# Patient Record
Sex: Male | Born: 1990 | Race: Black or African American | Hispanic: No | Marital: Single | Smoking: Current every day smoker
Health system: Southern US, Community
[De-identification: ages and names within clinical notes are randomized; demographics above are authoritative.]

## PROBLEM LIST (undated history)

## (undated) HISTORY — PX: ABDOMINAL SURGERY: SHX537

## (undated) HISTORY — PX: WISDOM TOOTH EXTRACTION: SHX21

---
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
## 2010-12-22 ENCOUNTER — Inpatient Hospital Stay (INDEPENDENT_AMBULATORY_CARE_PROVIDER_SITE_OTHER)
Admission: RE | Admit: 2010-12-22 | Discharge: 2010-12-22 | Disposition: A | Discharge status: Home / Self Care | Payer: Self-pay | Source: Ambulatory Visit | Attending: Emergency Medicine | Admitting: Emergency Medicine

## 2010-12-22 DIAGNOSIS — B86 Scabies: Secondary | ICD-10-CM

## 2012-10-07 ENCOUNTER — Encounter (HOSPITAL_COMMUNITY): Payer: Self-pay | Admitting: Emergency Medicine

## 2012-10-07 ENCOUNTER — Emergency Department (HOSPITAL_COMMUNITY)
Admission: EM | Admit: 2012-10-07 | Discharge: 2012-10-07 | Disposition: A | Discharge status: Home / Self Care | Payer: No Typology Code available for payment source | Attending: Emergency Medicine | Admitting: Emergency Medicine

## 2012-10-07 ENCOUNTER — Emergency Department (HOSPITAL_COMMUNITY): Payer: No Typology Code available for payment source

## 2012-10-07 DIAGNOSIS — Y9389 Activity, other specified: Secondary | ICD-10-CM | POA: Insufficient documentation

## 2012-10-07 DIAGNOSIS — S0083XA Contusion of other part of head, initial encounter: Secondary | ICD-10-CM | POA: Insufficient documentation

## 2012-10-07 DIAGNOSIS — S0003XA Contusion of scalp, initial encounter: Secondary | ICD-10-CM | POA: Insufficient documentation

## 2012-10-07 DIAGNOSIS — F17200 Nicotine dependence, unspecified, uncomplicated: Secondary | ICD-10-CM | POA: Insufficient documentation

## 2012-10-07 DIAGNOSIS — S20211A Contusion of right front wall of thorax, initial encounter: Secondary | ICD-10-CM

## 2012-10-07 DIAGNOSIS — S20219A Contusion of unspecified front wall of thorax, initial encounter: Secondary | ICD-10-CM | POA: Insufficient documentation

## 2012-10-07 DIAGNOSIS — Y92410 Unspecified street and highway as the place of occurrence of the external cause: Secondary | ICD-10-CM | POA: Insufficient documentation

## 2012-10-07 MED ORDER — IBUPROFEN 600 MG PO TABS: 600.0000 mg | ORAL_TABLET | Freq: Four times a day (QID) | ORAL | Status: DC | PRN

## 2012-10-07 MED ORDER — OXYCODONE-ACETAMINOPHEN 5-325 MG PO TABS
2.0000 | ORAL_TABLET | Freq: Once | ORAL | Status: AC
  Administered 2012-10-07: 2 via ORAL
  Filled 2012-10-07: qty 2

## 2012-10-07 MED ORDER — OXYCODONE-ACETAMINOPHEN 5-325 MG PO TABS: 2.0000 | ORAL_TABLET | ORAL | Status: DC | PRN

## 2012-10-07 MED ORDER — IBUPROFEN 800 MG PO TABS
800.0000 mg | ORAL_TABLET | Freq: Once | ORAL | Status: AC
  Administered 2012-10-07: 800 mg via ORAL
  Filled 2012-10-07: qty 1

## 2012-10-07 NOTE — ED Notes (Signed)
md at bedside  Pt alert and oriented x4. Respirations even and unlabored, bilateral symmetrical rise and fall of chest. Skin warm and dry. In no acute distress. Denies needs.   
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
## 2012-10-07 NOTE — ED Notes (Signed)
Pt back from x-ray.

## 2012-10-07 NOTE — ED Notes (Addendum)
md at bedside

## 2012-10-07 NOTE — ED Notes (Signed)
Abrasion to right jaw.

## 2012-10-07 NOTE — ED Notes (Signed)
Pt to xray

## 2012-10-20 ENCOUNTER — Ambulatory Visit: Payer: No Typology Code available for payment source | Attending: Family Medicine | Admitting: Physical Therapy

## 2012-10-22 ENCOUNTER — Ambulatory Visit: Payer: No Typology Code available for payment source | Attending: Family Medicine | Admitting: Physical Therapy

## 2012-10-22 DIAGNOSIS — M25559 Pain in unspecified hip: Secondary | ICD-10-CM | POA: Insufficient documentation

## 2012-10-22 DIAGNOSIS — M545 Low back pain, unspecified: Secondary | ICD-10-CM | POA: Insufficient documentation

## 2012-10-22 DIAGNOSIS — IMO0001 Reserved for inherently not codable concepts without codable children: Secondary | ICD-10-CM | POA: Insufficient documentation

## 2012-10-26 ENCOUNTER — Ambulatory Visit: Payer: No Typology Code available for payment source | Admitting: Rehabilitation

## 2012-10-28 ENCOUNTER — Ambulatory Visit: Payer: No Typology Code available for payment source | Admitting: Rehabilitation

## 2014-11-02 ENCOUNTER — Encounter (HOSPITAL_COMMUNITY): Payer: Self-pay | Admitting: Emergency Medicine

## 2014-11-02 ENCOUNTER — Emergency Department (HOSPITAL_COMMUNITY)
Admission: EM | Admit: 2014-11-02 | Discharge: 2014-11-02 | Disposition: A | Discharge status: Home / Self Care | Payer: Self-pay | Attending: Emergency Medicine | Admitting: Emergency Medicine

## 2014-11-02 DIAGNOSIS — Z720 Tobacco use: Secondary | ICD-10-CM | POA: Insufficient documentation

## 2014-11-02 DIAGNOSIS — K088 Other specified disorders of teeth and supporting structures: Secondary | ICD-10-CM | POA: Insufficient documentation

## 2014-11-02 DIAGNOSIS — Z79899 Other long term (current) drug therapy: Secondary | ICD-10-CM | POA: Insufficient documentation

## 2014-11-02 DIAGNOSIS — K0889 Other specified disorders of teeth and supporting structures: Secondary | ICD-10-CM

## 2014-11-02 MED ORDER — NAPROXEN 500 MG PO TABS: 500.0000 mg | ORAL_TABLET | Freq: Two times a day (BID) | ORAL | Status: DC

## 2014-11-02 MED ORDER — PENICILLIN V POTASSIUM 500 MG PO TABS: 500.0000 mg | ORAL_TABLET | Freq: Four times a day (QID) | ORAL | Status: DC

## 2014-11-02 NOTE — Discharge Instructions (Signed)

## 2014-11-02 NOTE — ED Notes (Signed)
Patient states has several broken teeth on L side.  Patient states has not been to dentist in years.   Patient states that "I've got to find a dentist that is reasonable".   Patient complains of pain in R lower teeth.

## 2014-11-02 NOTE — ED Provider Notes (Signed)
CSN: [id number]     Arrival date & time 11/02/14  1138 History  This chart was scribed for Waynetta Pean, PA-C working with Orpah Greek, MD by Randa Evens, ED Scribe. This patient was seen in room TR07C/TR07C and the patient's care was started at 11:54 AM.    Chief Complaint  Patient presents with  . Dental Pain   Patient is a 24 y.o. male presenting with tooth pain. The history is provided by the patient. No language interpreter was used.  Dental Pain Associated symptoms: no fever and no headaches    HPI Comments: Omar Moyer is a 24 y.o. male who presents to the Emergency Department complaining of intermittent right lower dental pain onset 1 year prior that's has recently worsened yesterday when the pain became constant and severe. He rates his pain at 10/10.  Pt states that he believes the filling popped off about 1 year ago. Pt states that he has tried aspirin with no relief. Denies fever, chills, trouble swallowing, changes to his vision, nausea, vomiting or ear pain.     History reviewed. No pertinent past medical history. Past Surgical History  Procedure Laterality Date  . Abdominal surgery      hernia repair   No family history on file. History  Substance Use Topics  . Smoking status: Current Every Day Smoker  . Smokeless tobacco: Not on file  . Alcohol Use: Yes     Comment: ooca    Review of Systems  Constitutional: Negative for fever and chills.  HENT: Positive for dental problem. Negative for ear pain, sore throat and trouble swallowing.   Eyes: Negative for pain and visual disturbance.  Gastrointestinal: Negative for nausea, vomiting and abdominal pain.  Skin: Negative for rash.  Neurological: Negative for light-headedness and headaches.     Allergies  Review of patient's allergies indicates no known allergies.  Home Medications   Prior to Admission medications   Medication Sig Start Date End Date Taking? Authorizing Provider  ibuprofen  (ADVIL,MOTRIN) 600 MG tablet Take 1 tablet (600 mg total) by mouth every 6 (six) hours as needed for pain. 10/07/12   Lacretia Leigh, MD  naproxen (NAPROSYN) 500 MG tablet Take 1 tablet (500 mg total) by mouth 2 (two) times daily with a meal. 11/02/14   Waynetta Pean, PA-C  oxyCODONE-acetaminophen (PERCOCET/ROXICET) 5-325 MG per tablet Take 2 tablets by mouth every 4 (four) hours as needed for pain. 10/07/12   Lacretia Leigh, MD  penicillin v potassium (VEETID) 500 MG tablet Take 1 tablet (500 mg total) by mouth 4 (four) times daily. 11/02/14   Waynetta Pean, PA-C   BP 144/76 mmHg  Pulse 54  Temp(Src) 98.6 F (37 C) (Oral)  Resp 14  SpO2 99%   Physical Exam  Constitutional: He is oriented to person, place, and time. He appears well-developed and well-nourished. No distress.  Nontoxic appearing.  HENT:  Head: Normocephalic and atraumatic.  Right Ear: External ear normal.  Left Ear: External ear normal.  Nose: Nose normal.  Mouth/Throat: Uvula is midline and oropharynx is clear and moist. No dental abscesses or uvula swelling. No oropharyngeal exudate.  Soft palate rises symmetrically, uvula midline without edema. No dental abscess, fluctuance, induration or discharge from the mouth. Tenderness to right lower molar. Bilateral tympanic membranes are pearly-gray without erythema or loss of landmarks.   Eyes: Conjunctivae are normal. Pupils are equal, round, and reactive to light. Right eye exhibits no discharge. Left eye exhibits no discharge.  Neck: Normal  range of motion. Neck supple. No JVD present.  Cardiovascular: Normal rate, regular rhythm, normal heart sounds and intact distal pulses.   Pulmonary/Chest: Effort normal and breath sounds normal. No respiratory distress.  Abdominal: Soft. There is no tenderness.  Lymphadenopathy:    He has no cervical adenopathy.  Neurological: He is alert and oriented to person, place, and time. No cranial nerve deficit. Coordination normal.  Cranial  nerves intact.   Skin: Skin is warm and dry. No rash noted. He is not diaphoretic.  Psychiatric: He has a normal mood and affect. His behavior is normal.  Nursing note and vitals reviewed.   ED Course  Procedures (including critical care time) DIAGNOSTIC STUDIES: Oxygen Saturation is 99% on RA, normal by my interpretation.    COORDINATION OF CARE: 12:19 PM-Discussed treatment plan with pt at bedside and pt agreed to plan.     Labs Review Labs Reviewed - No data to display  Imaging Review No results found.   EKG Interpretation None      Filed Vitals:   11/02/14 1144  BP: 144/76  Pulse: 54  Temp: 98.6 F (37 C)  TempSrc: Oral  Resp: 14  SpO2: 99%     MDM   Meds given in ED:  Medications - No data to display  New Prescriptions   NAPROXEN (NAPROSYN) 500 MG TABLET    Take 1 tablet (500 mg total) by mouth 2 (two) times daily with a meal.   PENICILLIN V POTASSIUM (VEETID) 500 MG TABLET    Take 1 tablet (500 mg total) by mouth 4 (four) times daily.    Final diagnoses:  Pain, dental    Patient with right lower toothache.  No gross abscess. He is afebrile and nontoxic appearing. Exam unconcerning for Ludwig's angina or spread of infection.  Will treat with penicillin and naproxen.  Urged patient to follow-up with dentist Dr. Mariel Sleet. I advised to take his discharge instructions to his appointment. I advised the patient to follow-up with their primary care provider this week. I advised the patient to return to the emergency department with new or worsening symptoms or new concerns. The patient verbalized understanding and agreement with plan.    I personally performed the services described in this documentation, which was scribed in my presence. The recorded information has been reviewed and is accurate.      Waynetta Pean, PA-C 11/02/14 1228  Orpah Greek, MD 11/03/14 [id number]

## 2016-09-01 ENCOUNTER — Encounter (HOSPITAL_COMMUNITY): Payer: Self-pay | Admitting: Emergency Medicine

## 2016-09-01 ENCOUNTER — Ambulatory Visit (HOSPITAL_COMMUNITY)
Admission: EM | Admit: 2016-09-01 | Discharge: 2016-09-01 | Disposition: A | Discharge status: Home / Self Care | Payer: Self-pay | Attending: Emergency Medicine | Admitting: Emergency Medicine

## 2016-09-01 DIAGNOSIS — J029 Acute pharyngitis, unspecified: Secondary | ICD-10-CM | POA: Insufficient documentation

## 2016-09-01 DIAGNOSIS — R0982 Postnasal drip: Secondary | ICD-10-CM | POA: Insufficient documentation

## 2016-09-01 DIAGNOSIS — J069 Acute upper respiratory infection, unspecified: Secondary | ICD-10-CM

## 2016-09-01 LAB — POCT RAPID STREP A: STREPTOCOCCUS, GROUP A SCREEN (DIRECT): NEGATIVE

## 2016-09-01 MED ORDER — CHLORPHEN-PE-ACETAMINOPHEN 4-10-325 MG PO TABS: ORAL_TABLET | ORAL | 0 refills | Status: DC

## 2016-09-01 NOTE — ED Provider Notes (Signed)
CSN: [id number]     Arrival date & time 09/01/16  1012 History   First MD Initiated Contact with Patient 09/01/16 1117     Chief Complaint  Patient presents with  . Sore Throat   (Consider location/radiation/quality/duration/timing/severity/associated sxs/prior Treatment) 26 year old male with sore throat for a week. Been getting worse over the past few days and this morning he felt like he had a golf ball in his throat. He has been feeling he will, sick like over the weekend. He also has headache and subjective often known fever. He has sniffles and heavy PND. Not taking any medications. Currently he is afebrile and has normal vital signs.      History reviewed. No pertinent past medical history. Past Surgical History:  Procedure Laterality Date  . ABDOMINAL SURGERY     hernia repair   Family History  Problem Relation Age of Onset  . Diabetes Father    Social History  Substance Use Topics  . Smoking status: Current Every Day Smoker  . Smokeless tobacco: Not on file  . Alcohol use Yes     Comment: ooca    Review of Systems  Constitutional: Positive for activity change and fever. Negative for diaphoresis and fatigue.  HENT: Positive for congestion, postnasal drip, rhinorrhea, sore throat and trouble swallowing. Negative for ear pain and facial swelling.   Eyes: Negative for pain, discharge and redness.  Respiratory: Positive for cough. Negative for chest tightness and shortness of breath.   Cardiovascular: Negative.   Gastrointestinal: Negative.   Musculoskeletal: Negative.  Negative for neck pain and neck stiffness.  Neurological: Negative.     Allergies  Patient has no known allergies.  Home Medications   Prior to Admission medications   Medication Sig Start Date End Date Taking? Authorizing Provider  Chlorphen-PE-Acetaminophen 4-10-325 MG TABS One q 4 to 6 hours prn drainage and congestion. May cause drowsiness. 09/01/16   Janne Napoleon, NP  ibuprofen (ADVIL,MOTRIN)  600 MG tablet Take 1 tablet (600 mg total) by mouth every 6 (six) hours as needed for pain. 10/07/12   Lacretia Leigh, MD   Meds Ordered and Administered this Visit  Medications - No data to display  BP 126/71 (BP Location: Left Arm)   Pulse 68   Temp 98.6 F (37 C) (Oral)   Resp 18   SpO2 99%  No data found.   Physical Exam  Constitutional: He is oriented to person, place, and time. He appears well-developed and well-nourished. No distress.  HENT:  Right Ear: External ear normal.  Left Ear: External ear normal.  Oropharynx with erythema. No swelling or exudate.  Lateral TMs are normal.  Eyes: EOM are normal. Pupils are equal, round, and reactive to light.  Neck: Normal range of motion. Neck supple.  Cardiovascular: Normal rate, regular rhythm, normal heart sounds and intact distal pulses.   Pulmonary/Chest: Effort normal and breath sounds normal. No respiratory distress. He has no wheezes.  Musculoskeletal: Normal range of motion. He exhibits no edema.  Lymphadenopathy:    He has no cervical adenopathy.  Neurological: He is alert and oriented to person, place, and time.  Skin: Skin is warm and dry.  Psychiatric: He has a normal mood and affect.  Nursing note and vitals reviewed.   Urgent Care Course     Procedures (including critical care time)  Labs Review Labs Reviewed  POCT RAPID STREP A   Results for orders placed or performed during the hospital encounter of 09/01/16  POCT rapid strep A (  [facility])  Result Value Ref Range   Streptococcus, Group A Screen (Direct) NEGATIVE NEGATIVE     Imaging Review No results found.   Visual Acuity Review  Right Eye Distance:   Left Eye Distance:   Bilateral Distance:    Right Eye Near:   Left Eye Near:    Bilateral Near:         MDM   1. Sore throat   2. Acute URI   3. PND (post-nasal drip)    You have an upper respiratory infection with a lot of drainage coming from your sinuses. This drainage is  running down your throat and causing you to have a very sore throat and occasional cough particularly when lying down. This is also causing headache and other discomfort. The below his a list of medications to help with your symptoms. In addition I will give you medication that she can take while you are at home or at bedtime which is a little bit more potent and can cause some drowsiness. The other listed medications do not cause drowsiness. Sudafed PE 10 mg every 4 to 6 hours as needed for congestion Allegra or Zyrtec daily as needed for drainage and runny nose. For stronger antihistamine may take Chlor-Trimeton 2 to 4 mg every 4 to 6 hours, may cause drowsiness. Saline nasal spray used frequently. Ibuprofen 600 mg every 6 hours as needed for pain, discomfort or fever. Drink plenty of fluids and stay well-hydrated.       Janne Napoleon, NP 09/01/16 1154

## 2016-09-01 NOTE — ED Triage Notes (Signed)
Sore throat, headache

## 2016-09-01 NOTE — Discharge Instructions (Signed)
You have an upper respiratory infection with a lot of drainage coming from your sinuses. This drainage is running down your throat and causing you to have a very sore throat and occasional cough particularly when lying down. This is also causing headache and other discomfort. The below his a list of medications to help with your symptoms. In addition I will give you medication that she can take while you are at home or at bedtime which is a little bit more potent and can cause some drowsiness. The other listed medications do not cause drowsiness. Sudafed PE 10 mg every 4 to 6 hours as needed for congestion Allegra or Zyrtec daily as needed for drainage and runny nose. For stronger antihistamine may take Chlor-Trimeton 2 to 4 mg every 4 to 6 hours, may cause drowsiness. Saline nasal spray used frequently. Ibuprofen 600 mg every 6 hours as needed for pain, discomfort or fever. Drink plenty of fluids and stay well-hydrated.

## 2016-09-04 LAB — CULTURE, GROUP A STREP (THRC)

## 2017-04-25 ENCOUNTER — Emergency Department (HOSPITAL_COMMUNITY)
Admission: EM | Admit: 2017-04-25 | Discharge: 2017-04-25 | Disposition: A | Discharge status: Home / Self Care | Payer: No Typology Code available for payment source | Attending: Emergency Medicine | Admitting: Emergency Medicine

## 2017-04-25 ENCOUNTER — Encounter (HOSPITAL_COMMUNITY): Payer: Self-pay | Admitting: Emergency Medicine

## 2017-04-25 ENCOUNTER — Emergency Department (HOSPITAL_COMMUNITY): Payer: No Typology Code available for payment source

## 2017-04-25 DIAGNOSIS — M545 Low back pain, unspecified: Secondary | ICD-10-CM

## 2017-04-25 DIAGNOSIS — Y92410 Unspecified street and highway as the place of occurrence of the external cause: Secondary | ICD-10-CM | POA: Insufficient documentation

## 2017-04-25 DIAGNOSIS — M79605 Pain in left leg: Secondary | ICD-10-CM | POA: Insufficient documentation

## 2017-04-25 DIAGNOSIS — Y999 Unspecified external cause status: Secondary | ICD-10-CM | POA: Diagnosis not present

## 2017-04-25 DIAGNOSIS — Y939 Activity, unspecified: Secondary | ICD-10-CM | POA: Insufficient documentation

## 2017-04-25 DIAGNOSIS — F17200 Nicotine dependence, unspecified, uncomplicated: Secondary | ICD-10-CM | POA: Insufficient documentation

## 2017-04-25 MED ORDER — CYCLOBENZAPRINE HCL 10 MG PO TABS: 10.0000 mg | ORAL_TABLET | Freq: Two times a day (BID) | ORAL | 0 refills | Status: DC | PRN

## 2017-04-25 MED ORDER — IBUPROFEN 400 MG PO TABS
600.0000 mg | ORAL_TABLET | Freq: Once | ORAL | Status: AC
  Administered 2017-04-25: 600 mg via ORAL
  Filled 2017-04-25: qty 1
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
## 2017-04-25 NOTE — ED Notes (Signed)
Declined W/C at D/C and was escorted to lobby by RN. 

## 2017-04-25 NOTE — Discharge Instructions (Signed)
The x-ray of your lower back was normal, no fractures or dislocations.  I have written you a prescription for a muscle relaxant Flexeril.  Please fill this prescription and take it to help relieve lower back muscle strain.  Remember that this medicine can make you drowsy, please do not work, drive or drink alcohol while taking it.  Please take 600 mg of ibuprofen every 6 hours for pain and inflammation for the next several days.  Pills over-the-counter are generally 200 mg, so you could take 3 of them if this is the case.  Apply heat to the lower back at least once a day to help relieve your symptoms.  I have also listed the information to [facility], which is a clinic near [facility] who accepts patients that do not have insurance.  Please call and schedule an appointment if your symptoms are not improving in a week.  Return to the emergency department if you develop any new or worsening symptoms.

## 2019-04-22 ENCOUNTER — Encounter (HOSPITAL_COMMUNITY): Payer: Self-pay

## 2019-04-22 ENCOUNTER — Emergency Department (HOSPITAL_COMMUNITY): Payer: Self-pay

## 2019-04-22 ENCOUNTER — Other Ambulatory Visit: Payer: Self-pay

## 2019-04-22 ENCOUNTER — Emergency Department (HOSPITAL_COMMUNITY)
Admission: EM | Admit: 2019-04-22 | Discharge: 2019-04-22 | Disposition: A | Discharge status: Home / Self Care | Payer: Self-pay | Attending: Emergency Medicine | Admitting: Emergency Medicine

## 2019-04-22 DIAGNOSIS — F17290 Nicotine dependence, other tobacco product, uncomplicated: Secondary | ICD-10-CM | POA: Insufficient documentation

## 2019-04-22 DIAGNOSIS — M84445A Pathological fracture, left finger(s), initial encounter for fracture: Secondary | ICD-10-CM | POA: Insufficient documentation

## 2019-04-22 MED ORDER — IBUPROFEN 200 MG PO TABS: 600.0000 mg | ORAL_TABLET | Freq: Four times a day (QID) | ORAL | Status: DC

## 2019-04-22 MED ORDER — ACETAMINOPHEN 325 MG PO TABS: 650.0000 mg | ORAL_TABLET | Freq: Four times a day (QID) | ORAL | Status: DC

## 2019-04-22 MED ORDER — OXYCODONE HCL 5 MG PO TABS: 5.0000 mg | ORAL_TABLET | Freq: Four times a day (QID) | ORAL | 0 refills | Status: DC | PRN

## 2019-04-22 MED ORDER — IBUPROFEN 200 MG PO TABS
600.0000 mg | ORAL_TABLET | Freq: Once | ORAL | Status: AC
  Administered 2019-04-22: 600 mg via ORAL
  Filled 2019-04-22: qty 1

## 2019-04-22 NOTE — Discharge Instructions (Signed)
Use buddy taping to protect your finger.  NO heavy gripping. Use the oxycodone sparingly over just the first couple of days

## 2019-04-22 NOTE — Consult Note (Signed)
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  He was pulling up his socks, when he felt a pop in the digit and has had the onset of pain.  At one point he thought there was some crookedness to it that does not seem to be the same as it longer.  He denies any prodromal pain, even with weightlifting.  History reviewed. No pertinent past medical history. Past Surgical History:  Procedure Laterality Date  . ABDOMINAL SURGERY     hernia repair   Social History   Socioeconomic History  . Marital status: Single    Spouse name: Not on file  . Number of children: Not on file  . Years of education: Not on file  . Highest education level: Not on file  Occupational History  . Not on file  Social Needs  . Financial resource strain: Not on file  . Food insecurity    Worry: Not on file    Inability: Not on file  . Transportation needs    Medical: Not on file    Non-medical: Not on file  Tobacco Use  . Smoking status: Current Every Day Smoker  . Smokeless tobacco: Current User  Substance and Sexual Activity  . Alcohol use: Yes    Comment: ooca  . Drug use: No  . Sexual activity: Not on file  Lifestyle  . Physical activity    Days per week: Not on file    Minutes per session: Not on file  . Stress: Not on file  Relationships  . Social Herbalist on phone: Not on file    Gets together: Not on file    Attends religious service: Not on file    Active member of club or organization: Not on file    Attends meetings of clubs or organizations: Not on file    Relationship status: Not on file  Other Topics Concern  . Not on file  Social History Narrative  . Not on file   Family History  Problem Relation Age of Onset  . Diabetes Father     No Known Allergies Prior to Admission medications   Medication Sig Start Date End Date Taking? Authorizing Provider  Chlorphen-PE-Acetaminophen 4-10-325 MG TABS One q 4 to 6 hours prn drainage and congestion. May cause drowsiness. 09/01/16   Janne Napoleon, NP  cyclobenzaprine (FLEXERIL) 10 MG tablet Take 1 tablet (10 mg total) by mouth 2 (two) times daily as needed for muscle spasms. 04/25/17   Glyn Ade, PA-C  ibuprofen (ADVIL,MOTRIN) 600 MG tablet Take 1 tablet (600 mg total) by mouth every 6 (six) hours as needed for pain. 10/07/12   Lacretia Leigh, MD   Dg Hand Complete Left  Result Date: 04/22/2019 CLINICAL DATA:  Finger injury EXAM: LEFT HAND - COMPLETE 3+ VIEW COMPARISON:  None. FINDINGS: Acute fracture involving the shaft and head of the second proximal phalanx with mild displacement. Underlying lucent slightly expansile bone lesion. No radiopaque foreign body. IMPRESSION: Acute mildly displaced fracture involving the shaft and head of the second proximal phalanx without articular surface extension. There is an underlying lucent lesion within the second proximal phalanx, consistent with pathologic fracture. Electronically Signed   By: Donavan Foil M.D.   On: 04/22/2019 19:26  Positive ROS: All other systems have been reviewed and were otherwise negative with the exception of those mentioned in the HPI and as above.  Physical Exam: Vitals: Refer to EMR. Constitutional:  WD, WN, NAD HEENT:  NCAT, EOMI Neuro/Psych:  Alert & oriented to person, place, and time; appropriate mood & affect Lymphatic: No generalized extremity edema or lymphadenopathy Extremities / MSK:  The extremities are normal with respect to appearance, ranges of motion, joint stability, muscle strength/tone, sensation, & perfusion except as otherwise noted:  Left index finger slightly swollen, more so along the proximal phalanx.  Digital flexion not full, but with about half of composite flexion obtained does  not appear to have significant ulceration in touchdown, indicating no significant angulation or malrotation at present.  The digit is not grossly unstable  Assessment: Pathologic fracture left index finger proximal phalanx without significant displacement  Plan: Discussed these findings with him.  I reviewed the radiographs.  I reviewed the plan that includes allowing the fracture to heal with nonoperative management, and then once healed sufficiently to restore really some stability proceeding with curettage of the lesion and bone grafting.  For now, we will go with buddy taping and activity modification, analgesic plan, with follow-up in the office in approximately a week.  Rayvon Char [address] Silos, [place], [place]  [id number] Office: [phone] Mobile: [phone]  04/22/2019, 9:28 PM

## 2019-04-22 NOTE — ED Triage Notes (Signed)
Pt arrives POV for eval of L sided pointer finger pain after over bending it 2 nights prior. Pt w/ ROM, minor swelling noted to L finger, denies numbness/tingling

## 2019-04-22 NOTE — ED Provider Notes (Signed)
[facility] Provider Note   CSN: [id number] Arrival date & time: 04/22/19  1747     History   Chief Complaint Chief Complaint  Patient presents with  . Hand Pain    HPI Omar Moyer is a 28 y.o. male with no significant past medical history who presents today for evaluation of pain in his left index finger.  He reports that 2 days ago he was pulling on a sock when he felt his finger twist and pop.  It was sore and then today he hit it on the steering wheel which made it feel worse.  He has not had any ibuprofen or Tylenol today.  He reports limited ability to move the finger secondary to pain.     HPI  History reviewed. No pertinent past medical history.  There are no active problems to display for this patient.   Past Surgical History:  Procedure Laterality Date  . ABDOMINAL SURGERY     hernia repair        Home Medications    Prior to Admission medications   Medication Sig Start Date End Date Taking? Authorizing Provider  acetaminophen (TYLENOL) 325 MG tablet Take 2 tablets (650 mg total) by mouth every 6 (six) hours. 04/22/19   Milly Jakob, MD  ibuprofen (ADVIL) 200 MG tablet Take 3 tablets (600 mg total) by mouth every 6 (six) hours. 04/22/19   Milly Jakob, MD  oxyCODONE (ROXICODONE) 5 MG immediate release tablet Take 1 tablet (5 mg total) by mouth every 6 (six) hours as needed (severe pain only, for first few days). 04/22/19   Milly Jakob, MD    Family History Family History  Problem Relation Age of Onset  . Diabetes Father     Social History Social History   Tobacco Use  . Smoking status: Current Every Day Smoker  . Smokeless tobacco: Current User  Substance Use Topics  . Alcohol use: Yes    Comment: ooca  . Drug use: No     Allergies   Patient has no known allergies.   Review of Systems Review of Systems  Constitutional: Negative for chills and fever.  Musculoskeletal: Positive for  joint swelling.  Skin: Negative for color change and wound.  All other systems reviewed and are negative.    Physical Exam Updated Vital Signs BP 132/88 (BP Location: Right Arm)   Pulse (!) 56   Temp 98.6 F (37 C) (Oral)   Resp 20   Ht 6\' 2"  (1.88 m)   Wt 93 kg   SpO2 98%   BMI 26.32 kg/m   Physical Exam Vitals signs and nursing note reviewed.  Constitutional:      General: He is not in acute distress. HENT:     Head: Normocephalic and atraumatic.  Cardiovascular:     Rate and Rhythm: Normal rate.     Pulses: Normal pulses.  Pulmonary:     Effort: Pulmonary effort is normal.  Musculoskeletal:     Comments: Left index finger has mild edema.  Range of motion is limited secondary to pain.  Remainder of hand is nontender.  Skin:    Capillary Refill: Capillary refill takes less than 2 seconds.     Comments: No wounds, abrasions or skin breaks present over the left index finger.  Neurological:     Mental Status: He is alert.     Comments: Sensation intact to left index finger.  Psychiatric:        Mood  and Affect: Mood normal.      ED Treatments / Results  Labs (all labs ordered are listed, but only abnormal results are displayed) Labs Reviewed - No data to display  EKG None  Radiology Dg Hand Complete Left  Result Date: 04/22/2019 CLINICAL DATA:  Finger injury EXAM: LEFT HAND - COMPLETE 3+ VIEW COMPARISON:  None. FINDINGS: Acute fracture involving the shaft and head of the second proximal phalanx with mild displacement. Underlying lucent slightly expansile bone lesion. No radiopaque foreign body. IMPRESSION: Acute mildly displaced fracture involving the shaft and head of the second proximal phalanx without articular surface extension. There is an underlying lucent lesion within the second proximal phalanx, consistent with pathologic fracture. Electronically Signed   By: Donavan Foil M.D.   On: 04/22/2019 19:26    Procedures Procedures (including critical care  time)  Medications Ordered in ED Medications  ibuprofen (ADVIL) tablet 600 mg (600 mg Oral Given 04/22/19 2217)     Initial Impression / Assessment and Plan / ED Course  I have reviewed the triage vital signs and the nursing notes.  Pertinent labs & imaging results that were available during my care of the patient were reviewed by me and considered in my medical decision making (see chart for details).  Clinical Course as of Apr 22 2227  Fri Apr 22, 2019  2129 I spoke with Dr. Grandville Silos who will come see the patient.    [EH]    Clinical Course User Index [EH] Lorin Glass, PA-C      Patient presents today for evaluation of left finger pain.  X-rays were obtained showing a left second Proximal phalanx fracture with concern for underlying pathologic fracture due to an underlying lucent lesion.  Range of motion limited secondary to pain.  No evidence of wounds or abrasions to suggest an open fracture or evidence of infection.  I spoke with Dr. Grandville Silos on-call for hand who came to see the patient and prescribed discharge medications.  Return precautions were discussed with patient who states their understanding.  At the time of discharge patient denied any unaddressed complaints or concerns.  Patient is agreeable for discharge home.   Final Clinical Impressions(s) / ED Diagnoses   Final diagnoses:  Pathological fracture, left finger(s), initial encounter for fracture    ED Discharge Orders         Ordered    ibuprofen (ADVIL) 200 MG tablet  Every 6 hours     04/22/19 2159    acetaminophen (TYLENOL) 325 MG tablet  Every 6 hours     04/22/19 2159    oxyCODONE (ROXICODONE) 5 MG immediate release tablet  Every 6 hours PRN     04/22/19 2159           Lorin Glass, PA-C 04/22/19 2230    Louanne Skye, MD 04/26/19 [id number]

## 2019-07-05 ENCOUNTER — Other Ambulatory Visit: Payer: Self-pay | Admitting: Orthopedic Surgery

## 2019-07-11 ENCOUNTER — Encounter (HOSPITAL_BASED_OUTPATIENT_CLINIC_OR_DEPARTMENT_OTHER): Payer: Self-pay | Admitting: Orthopedic Surgery

## 2019-07-11 ENCOUNTER — Other Ambulatory Visit: Payer: Self-pay

## 2019-07-14 ENCOUNTER — Other Ambulatory Visit (HOSPITAL_COMMUNITY)
Admission: RE | Admit: 2019-07-14 | Discharge: 2019-07-14 | Disposition: A | Discharge status: Home / Self Care | Payer: 59 | Source: Ambulatory Visit | Attending: Orthopedic Surgery | Admitting: Orthopedic Surgery

## 2019-07-14 DIAGNOSIS — Z01812 Encounter for preprocedural laboratory examination: Secondary | ICD-10-CM | POA: Diagnosis present

## 2019-07-14 DIAGNOSIS — Z20822 Contact with and (suspected) exposure to covid-19: Secondary | ICD-10-CM | POA: Insufficient documentation

## 2019-07-14 LAB — SARS CORONAVIRUS 2 (TAT 6-24 HRS): SARS Coronavirus 2: NEGATIVE

## 2019-07-14 NOTE — H&P (Signed)
Omar Moyer is an 29 y.o. male.   CC / Reason for Visit: Left index finger problem HPI: This patient returns to clinic today for reevaluation of his left index finger.  He has been working on range of motion and continues to work as a Education officer, community for [employer].  He reports that he now does have insurance and would like to move forward with surgery.  He knows that he will more than likely be unable to work in a completely normal fashion for approximately 6-8 weeks.  HPI12/03/2019:This patient returns to clinic today for reevaluation of his left index finger.  He reports that he has been working diligently on range of motion and that he signed up for [employer] care which will begin in January through [facility] care.  He would like to move forward with surgery in January.  HPI 05/03/2019:This patient is a 29 year old male with a left index finger pathologic fracture of P1 who presents for reevaluation.  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 No pertinent past medical history.  Past Surgical History:  Procedure Laterality Date  . ABDOMINAL SURGERY     hernia repair    Family History  Problem Relation Age of Onset  . Diabetes Father    Social History:  reports that he has been smoking cigarettes. He uses smokeless tobacco. He reports current alcohol use. He reports that he does not use drugs.  Allergies: No Known Allergies  No medications prior to admission.    No results found for this or any previous visit (from the past 48 hour(s)). No results found.  Review of Systems  All other systems reviewed and are negative.   Height 6\' 2"  (1.88 m), weight 95.3 kg. Physical Exam  Constitutional:  WD, WN, NAD HEENT:  NCAT, EOMI Neuro/Psych:  Alert & oriented to person, place, and time; appropriate mood &  affect Lymphatic: No generalized UE edema or lymphadenopathy Extremities / MSK:  Both UE are normal with respect to appearance, ranges of motion, joint stability, muscle strength/tone, sensation, & perfusion except as otherwise noted:  Left index finger remains swollen about the proximal and middle phalangeal levels.  Motion is not quite full with about a centimeter pad to palm. Touchdown point is somewhat altered and appears more radially rotated from previous evaluation.  Labs / Xrays:  3 views of the left index finger ordered and obtained today reveals no change in alignment when compared to the injury x-rays.  There is an expansile lesion in the proximal phalanx with thinning of the cortex, consistent with a bone cyst or enchondroma.  There does appear to be some bony healing as well as some bone bridging through the fracture site.  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   Plan: I discussed these findings with him.  He wishes to move forward with surgical intervention including bone grafting from the distal radius to repair the enchrondroma in the left IF. The details of the operative procedure were discussed with the patient.  Questions were invited and answered.  In addition to the goal of the procedure, the risks of the procedure to include but not limited to bleeding; infection; damage to the nerves or blood vessels that could result in bleeding, numbness, weakness, chronic pain, and the need for additional procedures;  stiffness; the need for revision surgery; and anesthetic risks were reviewed.  No specific outcome was guaranteed or implied.  Informed consent was obtained.   Jolyn Nap, MD 07/14/2019, 3:41 PM

## 2019-07-17 NOTE — Anesthesia Preprocedure Evaluation (Addendum)
Anesthesia Evaluation  Patient identified by MRN, date of birth, ID band Patient awake    Reviewed: Allergy & Precautions, NPO status , Patient's Chart, lab work & pertinent test results  Airway Mallampati: II  TM Distance: >3 FB Neck ROM: Full    Dental no notable dental hx. (+) Teeth Intact, Loose   Pulmonary Current Smoker and Patient abstained from smoking.,    Pulmonary exam normal breath sounds clear to auscultation       Cardiovascular negative cardio ROS Normal cardiovascular exam Rhythm:Regular Rate:Normal     Neuro/Psych negative neurological ROS     GI/Hepatic negative GI ROS, Neg liver ROS,   Endo/Other  negative endocrine ROS  Renal/GU negative Renal ROS     Musculoskeletal negative musculoskeletal ROS (+)   Abdominal   Peds  Hematology negative hematology ROS (+)   Anesthesia Other Findings   Reproductive/Obstetrics                            Anesthesia Physical Anesthesia Plan  ASA: II  Anesthesia Plan: Regional and MAC   Post-op Pain Management:    Induction:   PONV Risk Score and Plan: Treatment may vary due to age or medical condition, Midazolam and Propofol infusion  Airway Management Planned: Nasal Cannula and Natural Airway  Additional Equipment: None  Intra-op Plan:   Post-operative Plan:   Informed Consent: I have reviewed the patients History and Physical, chart, labs and discussed the procedure including the risks, benefits and alternatives for the proposed anesthesia with the patient or authorized representative who has indicated his/her understanding and acceptance.       Plan Discussed with:   Anesthesia Plan Comments: [facility] w L supraclav block)       Anesthesia Quick Evaluation

## 2019-07-18 ENCOUNTER — Ambulatory Visit (HOSPITAL_BASED_OUTPATIENT_CLINIC_OR_DEPARTMENT_OTHER): Payer: 59 | Admitting: Anesthesiology

## 2019-07-18 ENCOUNTER — Other Ambulatory Visit: Payer: Self-pay

## 2019-07-18 ENCOUNTER — Ambulatory Visit (HOSPITAL_BASED_OUTPATIENT_CLINIC_OR_DEPARTMENT_OTHER)
Admission: RE | Admit: 2019-07-18 | Discharge: 2019-07-18 | Disposition: A | Discharge status: Home / Self Care | Payer: 59 | Attending: Orthopedic Surgery | Admitting: Orthopedic Surgery

## 2019-07-18 ENCOUNTER — Ambulatory Visit (HOSPITAL_COMMUNITY): Payer: 59

## 2019-07-18 ENCOUNTER — Encounter (HOSPITAL_BASED_OUTPATIENT_CLINIC_OR_DEPARTMENT_OTHER): Payer: Self-pay | Admitting: Orthopedic Surgery

## 2019-07-18 ENCOUNTER — Encounter (HOSPITAL_BASED_OUTPATIENT_CLINIC_OR_DEPARTMENT_OTHER)
Admission: RE | Disposition: A | Discharge status: Home / Self Care | Payer: Self-pay | Source: Home / Self Care | Attending: Orthopedic Surgery

## 2019-07-18 DIAGNOSIS — Z833 Family history of diabetes mellitus: Secondary | ICD-10-CM | POA: Diagnosis not present

## 2019-07-18 DIAGNOSIS — D1612 Benign neoplasm of short bones of left upper limb: Secondary | ICD-10-CM | POA: Diagnosis present

## 2019-07-18 DIAGNOSIS — F17210 Nicotine dependence, cigarettes, uncomplicated: Secondary | ICD-10-CM | POA: Insufficient documentation

## 2019-07-18 DIAGNOSIS — M8560 Other cyst of bone, unspecified site: Secondary | ICD-10-CM

## 2019-07-18 HISTORY — PX: BONE EXCISION: SHX6730

## 2019-07-18 SURGERY — BONE EXCISION
Anesthesia: Monitor Anesthesia Care | Site: Finger | Laterality: Left

## 2019-07-18 MED ORDER — OXYCODONE HCL 5 MG PO TABS: 5.0000 mg | ORAL_TABLET | Freq: Four times a day (QID) | ORAL | 0 refills | Status: DC | PRN

## 2019-07-18 MED ORDER — PROPOFOL 10 MG/ML IV BOLUS
INTRAVENOUS | Status: DC | PRN
  Administered 2019-07-18: 40 mg via INTRAVENOUS

## 2019-07-18 MED ORDER — BUPIVACAINE HCL (PF) 0.5 % IJ SOLN
INTRAMUSCULAR | Status: AC
  Filled 2019-07-18: qty 90

## 2019-07-18 MED ORDER — LIDOCAINE-EPINEPHRINE 2 %-1:100000 IJ SOLN
INTRAMUSCULAR | Status: AC
  Filled 2019-07-18: qty 2

## 2019-07-18 MED ORDER — ROPIVACAINE HCL 5 MG/ML IJ SOLN
INTRAMUSCULAR | Status: DC | PRN
  Administered 2019-07-18: 150 mg via PERINEURAL

## 2019-07-18 MED ORDER — LIDOCAINE-EPINEPHRINE 1 %-1:100000 IJ SOLN
INTRAMUSCULAR | Status: AC
  Filled 2019-07-18: qty 1

## 2019-07-18 MED ORDER — ONDANSETRON HCL 4 MG/2ML IJ SOLN: 4.0000 mg | Freq: Once | INTRAMUSCULAR | Status: DC | PRN

## 2019-07-18 MED ORDER — FENTANYL CITRATE (PF) 100 MCG/2ML IJ SOLN
INTRAMUSCULAR | Status: AC
  Filled 2019-07-18: qty 2

## 2019-07-18 MED ORDER — MIDAZOLAM HCL 2 MG/2ML IJ SOLN
1.0000 mg | INTRAMUSCULAR | Status: DC | PRN
  Administered 2019-07-18: 2 mg via INTRAVENOUS

## 2019-07-18 MED ORDER — 0.9 % SODIUM CHLORIDE (POUR BTL) OPTIME
TOPICAL | Status: DC | PRN
  Administered 2019-07-18: 100 mL

## 2019-07-18 MED ORDER — PROPOFOL 500 MG/50ML IV EMUL
INTRAVENOUS | Status: DC | PRN
  Administered 2019-07-18: 100 ug/kg/min via INTRAVENOUS

## 2019-07-18 MED ORDER — OXYCODONE HCL 5 MG/5ML PO SOLN: 5.0000 mg | Freq: Once | ORAL | Status: DC | PRN

## 2019-07-18 MED ORDER — CEFAZOLIN SODIUM-DEXTROSE 2-4 GM/100ML-% IV SOLN
INTRAVENOUS | Status: AC
  Filled 2019-07-18: qty 100

## 2019-07-18 MED ORDER — MIDAZOLAM HCL 2 MG/2ML IJ SOLN
INTRAMUSCULAR | Status: AC
  Filled 2019-07-18: qty 2

## 2019-07-18 MED ORDER — HYDROMORPHONE HCL 1 MG/ML IJ SOLN: 0.2500 mg | INTRAMUSCULAR | Status: DC | PRN

## 2019-07-18 MED ORDER — CEFAZOLIN SODIUM-DEXTROSE 2-4 GM/100ML-% IV SOLN
2.0000 g | INTRAVENOUS | Status: AC
  Administered 2019-07-18: 2 g via INTRAVENOUS

## 2019-07-18 MED ORDER — ONDANSETRON HCL 4 MG/2ML IJ SOLN
INTRAMUSCULAR | Status: DC | PRN
  Administered 2019-07-18: 4 mg via INTRAVENOUS

## 2019-07-18 MED ORDER — LACTATED RINGERS IV SOLN: INTRAVENOUS | Status: DC

## 2019-07-18 MED ORDER — CHLORHEXIDINE GLUCONATE 4 % EX LIQD: 60.0000 mL | Freq: Once | CUTANEOUS | Status: DC

## 2019-07-18 MED ORDER — IBUPROFEN 200 MG PO TABS: 600.0000 mg | ORAL_TABLET | Freq: Four times a day (QID) | ORAL | 0 refills | Status: DC

## 2019-07-18 MED ORDER — FENTANYL CITRATE (PF) 100 MCG/2ML IJ SOLN
50.0000 ug | INTRAMUSCULAR | Status: DC | PRN
  Administered 2019-07-18: 100 ug via INTRAVENOUS

## 2019-07-18 MED ORDER — LIDOCAINE 2% (20 MG/ML) 5 ML SYRINGE
INTRAMUSCULAR | Status: DC | PRN
  Administered 2019-07-18: 60 mg via INTRAVENOUS

## 2019-07-18 MED ORDER — OXYCODONE HCL 5 MG PO TABS: 5.0000 mg | ORAL_TABLET | Freq: Once | ORAL | Status: DC | PRN

## 2019-07-18 SURGICAL SUPPLY — 58 items
APL PRP STRL LF DISP 70% ISPRP (MISCELLANEOUS) ×1
BAND INSRT 18 STRL LF DISP RB (MISCELLANEOUS)
BAND RUBBER #18 3X1/16 STRL (MISCELLANEOUS) IMPLANT
BLADE MINI RND TIP GREEN BEAV (BLADE) IMPLANT
BLADE SURG 15 STRL LF DISP TIS (BLADE) ×1 IMPLANT
BLADE SURG 15 STRL SS (BLADE) ×6
BNDG CMPR 9X4 STRL LF SNTH (GAUZE/BANDAGES/DRESSINGS) ×1
BNDG COHESIVE 2X5 TAN STRL LF (GAUZE/BANDAGES/DRESSINGS) IMPLANT
BNDG COHESIVE 4X5 TAN STRL (GAUZE/BANDAGES/DRESSINGS) ×3 IMPLANT
BNDG ESMARK 4X9 LF (GAUZE/BANDAGES/DRESSINGS) ×2 IMPLANT
BNDG GAUZE 1X2.1 STRL (MISCELLANEOUS) IMPLANT
BNDG GAUZE ELAST 4 BULKY (GAUZE/BANDAGES/DRESSINGS) ×3 IMPLANT
BNDG PLASTER X FAST 3X3 WHT LF (CAST SUPPLIES) ×14 IMPLANT
BNDG PLSTR 9X3 FST ST WHT (CAST SUPPLIES) ×7
CANISTER SUCT 1200ML W/VALVE (MISCELLANEOUS) ×2 IMPLANT
CHLORAPREP W/TINT 26 (MISCELLANEOUS) ×3 IMPLANT
CORD BIPOLAR FORCEPS 12FT (ELECTRODE) ×2 IMPLANT
COVER BACK TABLE 60X90IN (DRAPES) ×3 IMPLANT
COVER MAYO STAND STRL (DRAPES) ×3 IMPLANT
COVER WAND RF STERILE (DRAPES) IMPLANT
CUFF TOURN SGL QUICK 18X4 (TOURNIQUET CUFF) IMPLANT
DRAIN PENROSE 1/2X12 LTX STRL (WOUND CARE) IMPLANT
DRAPE C-ARM 42X72 X-RAY (DRAPES) ×2 IMPLANT
DRAPE EXTREMITY T 121X128X90 (DISPOSABLE) ×3 IMPLANT
DRAPE OEC MINIVIEW 54X84 (DRAPES) ×1 IMPLANT
DRAPE SURG 17X23 STRL (DRAPES) ×3 IMPLANT
DRSG EMULSION OIL 3X3 NADH (GAUZE/BANDAGES/DRESSINGS) ×3 IMPLANT
GAUZE SPONGE 4X4 12PLY STRL LF (GAUZE/BANDAGES/DRESSINGS) ×3 IMPLANT
GLOVE BIO SURGEON STRL SZ7.5 (GLOVE) ×3 IMPLANT
GLOVE BIOGEL PI IND STRL 7.0 (GLOVE) ×1 IMPLANT
GLOVE BIOGEL PI IND STRL 8 (GLOVE) ×1 IMPLANT
GLOVE BIOGEL PI INDICATOR 7.0 (GLOVE) ×4
GLOVE BIOGEL PI INDICATOR 8 (GLOVE) ×2
GLOVE ECLIPSE 6.5 STRL STRAW (GLOVE) ×3 IMPLANT
GLOVE SURG SS PI 7.0 STRL IVOR (GLOVE) ×2 IMPLANT
GOWN STRL REUS W/TWL XL LVL3 (GOWN DISPOSABLE) ×3 IMPLANT
NDL HYPO 25X1 1.5 SAFETY (NEEDLE) IMPLANT
NEEDLE HYPO 25X1 1.5 SAFETY (NEEDLE) IMPLANT
NS IRRIG 1000ML POUR BTL (IV SOLUTION) ×3 IMPLANT
PACK BASIN DAY SURGERY FS (CUSTOM PROCEDURE TRAY) ×3 IMPLANT
PAD CAST 4YDX4 CTTN HI CHSV (CAST SUPPLIES) IMPLANT
PADDING CAST ABS 4INX4YD NS (CAST SUPPLIES)
PADDING CAST ABS COTTON 4X4 ST (CAST SUPPLIES) IMPLANT
PADDING CAST COTTON 4X4 STRL (CAST SUPPLIES) ×3
SLING ARM FOAM STRAP LRG (SOFTGOODS) ×2 IMPLANT
STOCKINETTE 6  STRL (DRAPES) ×2
STOCKINETTE 6 STRL (DRAPES) ×1 IMPLANT
SUCTION FRAZIER HANDLE 10FR (MISCELLANEOUS) ×2
SUCTION TUBE FRAZIER 10FR DISP (MISCELLANEOUS) IMPLANT
SUT VIC AB 3-0 PS2 18 (SUTURE) ×2 IMPLANT
SUT VICRYL RAPIDE 4-0 (SUTURE) IMPLANT
SUT VICRYL RAPIDE 4/0 PS 2 (SUTURE) ×2 IMPLANT
SYR 10ML LL (SYRINGE) IMPLANT
SYR BULB 3OZ (MISCELLANEOUS) ×3 IMPLANT
TOWEL GREEN STERILE FF (TOWEL DISPOSABLE) ×5 IMPLANT
TUBE CONNECTING 20'X1/4 (TUBING) ×1
TUBE CONNECTING 20X1/4 (TUBING) ×1 IMPLANT
UNDERPAD 30X36 HEAVY ABSORB (UNDERPADS AND DIAPERS) ×3 IMPLANT

## 2019-07-18 NOTE — Transfer of Care (Signed)
Immediate Anesthesia Transfer of Care Note  Patient: Omar Moyer  Procedure(s) Performed: LEFT INDEX FINGER LESION CURRETTAGE AND BONE GRAFTING FROM DISTAL RADIUS (Left Finger)  Patient Location: PACU  Anesthesia Type:MAC  Level of Consciousness: awake, alert  and oriented  Airway & Oxygen Therapy: Patient Spontanous Breathing  Post-op Assessment: Report given to RN and Post -op Vital signs reviewed and stable  Post vital signs: Reviewed and stable  Last Vitals:  Vitals Value Taken Time  BP 121/74 07/18/19 0857  Temp    Pulse 54 07/18/19 0859  Resp 15 07/18/19 0859  SpO2 99 % 07/18/19 0859  Vitals shown include unvalidated device data.  Last Pain:  Vitals:   07/18/19 0652  TempSrc: Oral  PainSc: 1          Complications: No apparent anesthesia complications

## 2019-07-18 NOTE — Anesthesia Procedure Notes (Signed)
Procedure Name: MAC Date/Time: 07/18/2019 7:35 AM Performed by: Alain Marion, CRNA Pre-anesthesia Checklist: Patient identified, Emergency Drugs available, Suction available and Patient being monitored Patient Re-evaluated:Patient Re-evaluated prior to induction Oxygen Delivery Method: Simple face mask Placement Confirmation: positive ETCO2

## 2019-07-18 NOTE — Anesthesia Postprocedure Evaluation (Signed)
Anesthesia Post Note  Patient: Omar Moyer  Procedure(s) Performed: LEFT INDEX FINGER LESION CURRETTAGE AND BONE GRAFTING FROM DISTAL RADIUS (Left Finger)     Patient location during evaluation: PACU Anesthesia Type: Regional Level of consciousness: awake and alert Pain management: pain level controlled Vital Signs Assessment: post-procedure vital signs reviewed and stable Respiratory status: spontaneous breathing, nonlabored ventilation, respiratory function stable and patient connected to nasal cannula oxygen Cardiovascular status: stable and blood pressure returned to baseline Postop Assessment: no apparent nausea or vomiting Anesthetic complications: no    Last Vitals:  Vitals:   07/18/19 0930 07/18/19 1000  BP: 128/88 125/86  Pulse: (!) 48 (!) 47  Resp: 17 20  Temp:  36.6 C  SpO2: 99% 98%    Last Pain:  Vitals:   07/18/19 1000  TempSrc:   PainSc: 0-No pain                 Barnet Glasgow

## 2019-07-18 NOTE — Interval H&P Note (Signed)
History and Physical Interval Note:  07/18/2019 7:31 AM  Omar Moyer  has presented today for surgery, with the diagnosis of LEFT INDEX FINGER BONE LESION.  The various methods of treatment have been discussed with the patient and family. After consideration of risks, benefits and other options for treatment, the patient has consented to  Procedure(s) with comments: LEFT INDEX FINGER LESION CURRETTAGE + BONE GRAFTING FROM DISTAL RADIUS (Left) - SURGERY REQUEST TIME: 90 MIN PROCEDURE: LEFT INDEX FINGER LESION CURRETTAGE + BONE GRAFTING FROM DISTAL RADIUS  MAC + PREOP BLOCK as a surgical intervention.  The patient's history has been reviewed, patient examined, no change in status, stable for surgery.  I have reviewed the patient's chart and labs.  Questions were answered to the patient's satisfaction.     Jolyn Nap

## 2019-07-18 NOTE — Anesthesia Procedure Notes (Addendum)
Anesthesia Regional Block: Supraclavicular block   Pre-Anesthetic Checklist: ,, timeout performed, Correct Patient, Correct Site, Correct Laterality, Correct Procedure, Correct Position, site marked, Risks and benefits discussed,  Surgical consent,  Pre-op evaluation,  At surgeon's request and post-op pain management  Laterality: Left  Prep: chloraprep       Needles:  Injection technique: Single-shot  Needle Type: Echogenic Needle     Needle Length: 5cm  Needle Gauge: 21     Additional Needles:   Procedures:,,,, ultrasound used (permanent image in chart),,,,  Narrative:  Start time: 07/18/2019 7:05 AM End time: 07/18/2019 7:15 AM Injection made incrementally with aspirations every 5 mL.  Performed by: Personally  Anesthesiologist: Barnet Glasgow, MD

## 2019-07-18 NOTE — Progress Notes (Signed)
Assisted Dr. Houser with left, ultrasound guided, supraclavicular block. Side rails up, monitors on throughout procedure. See vital signs in flow sheet. Tolerated Procedure well. °

## 2019-07-18 NOTE — Discharge Instructions (Signed)
Discharge Instructions   You have a dressing with a plaster splint incorporated in it. Move your fingers as much as possible, making a full fist and fully opening the fist. Elevate your hand to reduce pain & swelling of the digits.  Ice over the operative site may be helpful to reduce pain & swelling.  DO NOT USE HEAT. Pain medicine has been prescribed for you.  Take Tylenol 650 mg every 6 hours for pain. Avoid Advil if possible.  Additionally, take Oxycodone 5 mg for severe post operative pain as a rescue medicine. Leave the dressing in place until you return to our office.  You may shower, but keep the bandage clean & dry.  You may drive a car when you are off of prescription pain medications and can safely control your vehicle with both hands. Our office will call you to arrange follow-up   Please call [phone] during normal business hours or [phone] after hours for any problems. Including the following:  - excessive redness of the incisions - drainage for more than 4 days - fever of more than 101.5 F  *Please note that pain medications will not be refilled after hours or on weekends.  WORK STATUS: Out of work until 1st post operative appointment in 10-15 days from date of surgery.    Post Anesthesia Home Care Instructions  Activity: Get plenty of rest for the remainder of the day. A responsible individual must stay with you for 24 hours following the procedure.  For the next 24 hours, DO NOT: -Drive a car -Paediatric nurse -Drink alcoholic beverages -Take any medication unless instructed by your physician -Make any legal decisions or sign important papers.  Meals: Start with liquid foods such as gelatin or soup. Progress to regular foods as tolerated. Avoid greasy, spicy, heavy foods. If nausea and/or vomiting occur, drink only clear liquids until the nausea and/or vomiting subsides. Call your physician if vomiting continues.  Special Instructions/Symptoms: Your  throat may feel dry or sore from the anesthesia or the breathing tube placed in your throat during surgery. If this causes discomfort, gargle with warm salt water. The discomfort should disappear within 24 hours.  If you had a scopolamine patch placed behind your ear for the management of post- operative nausea and/or vomiting:  1. The medication in the patch is effective for 72 hours, after which it should be removed.  Wrap patch in a tissue and discard in the trash. Wash hands thoroughly with soap and water. 2. You may remove the patch earlier than 72 hours if you experience unpleasant side effects which may include dry mouth, dizziness or visual disturbances. 3. Avoid touching the patch. Wash your hands with soap and water after contact with the patch.    Regional Anesthesia Blocks  1. Numbness or the inability to move the "blocked" extremity may last from 3-48 hours after placement. The length of time depends on the medication injected and your individual response to the medication. If the numbness is not going away after 48 hours, call your surgeon.  2. The extremity that is blocked will need to be protected until the numbness is gone and the  Strength has returned. Because you cannot feel it, you will need to take extra care to avoid injury. Because it may be weak, you may have difficulty moving it or using it. You may not know what position it is in without looking at it while the block is in effect.  3. For blocks in the legs  and feet, returning to weight bearing and walking needs to be done carefully. You will need to wait until the numbness is entirely gone and the strength has returned. You should be able to move your leg and foot normally before you try and bear weight or walk. You will need someone to be with you when you first try to ensure you do not fall and possibly risk injury.  4. Bruising and tenderness at the needle site are common side effects and will resolve in a few  days.  5. Persistent numbness or new problems with movement should be communicated to the surgeon or the [facility] [phone] [place] [phone]).

## 2019-07-18 NOTE — Op Note (Signed)
07/18/2019  7:32 AM  PATIENT:  Omar Moyer  29 y.o. 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 Grandville Silos, MD  PHYSICIAN ASSISTANT: Morley Kos, OPA-C  ANESTHESIA:  regional and MAC  SPECIMENS: To pathology  DRAINS:   None  EBL:  less than 50 mL  PREOPERATIVE INDICATIONS:  [name] is a  29 y.o. 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  The fx has healed sufficiently to proceed with curretteage and bone grafting of the lesion  The risks benefits and alternatives were discussed with the patient preoperatively including but not limited to the risks of infection, bleeding, nerve injury, cardiopulmonary complications, the need for revision surgery, among others, and the patient verbalized understanding and consented to proceed.  OPERATIVE IMPLANTS: none  OPERATIVE PROCEDURE:  After receiving prophylactic antibiotics, a pre-scrub, and a regional block, the patient was escorted to the operative theatre and placed in a supine position.  A surgical "time-out" was performed during which the planned procedure, proposed operative site, and the correct patient identity were compared to the operative consent and agreement confirmed by the circulating nurse according to current facility policy.  Following application of a tourniquet to the operative extremity, the exposed skin was prepped with Chloraprep and draped in the usual sterile fashion.  The limb was exsanguinated with an Esmarch bandage and the tourniquet inflated to approximately 115mHg higher than systolic BP.  A linear longitudinal incision was made overlying the dorsal aspect of the left index finger P1.  Skin was incised sharply with a scalpel, subcutaneous tissues dissected with a combination of blunt, spreading, sharp  dissection.  The extensor apparatus was split in the midline and reflected.  Periosteum was split and reflected.  A cortical window was made over the major portion of the radiographic lesion, it was extended distally and became a keyhole shaped cortical window.  Soft granular tissue was encountered, clinically consistent with enchondroma.  It was removed meticulously with curette and suction.  This was confirmed through direct vision with loupe assisted magnification and confirmed fluoroscopically, determining the position of the curette and the various aspects of the underlying lesion.  Specimen was passed off.  Attention was shifted to obtaining the bone graft, where a linear incision was localized over the dorsal radial metaphyseal flare of the radius, using fluoroscopy.  The area was at the margin between the second and third compartment extensor tendons, leaving the distal retinaculum intact.  A cortical window was made for later replacement and bone graft was harvested.  This was done with 2 or 3 different segments of harvesting, each time packing it into the defect until sufficient graft had been obtained but without wasting graft.  Once the graft was fully packed into the defect in the digit, final images for both sites were taken.  Gelfoam was placed into the defect in the radius, the cortical window replaced, and the periosteum overlying the digit reapproximated with 4-0 Vicryl Rapide running suture.  The extensor apparatus was repaired with 3-0 Vicryl running suture and the tourniquet was released.  Additional hemostasis was unnecessary and the skin at both sites was reapproximated with 4-0 Vicryl Rapide running horizontal mattress sutures.  Short arm bulky splint dressing was applied and he was taken to the recovery room.  DISPOSITION: He will  be discharged home today with typical instructions, returning in 10 to 15 days with new x-rays of the left index finger out of splint, and follow-on hand  therapy appointment for custom splint fabrication for protection and initiation of rehab.

## 2019-07-19 ENCOUNTER — Encounter: Payer: Self-pay | Admitting: *Deleted

## 2019-07-19 LAB — SURGICAL PATHOLOGY

## 2019-09-03 ENCOUNTER — Emergency Department (HOSPITAL_COMMUNITY)
Admission: EM | Admit: 2019-09-03 | Discharge: 2019-09-03 | Disposition: A | Discharge status: Home / Self Care | Payer: 59 | Attending: Emergency Medicine | Admitting: Emergency Medicine

## 2019-09-03 ENCOUNTER — Other Ambulatory Visit: Payer: Self-pay

## 2019-09-03 ENCOUNTER — Encounter (HOSPITAL_COMMUNITY): Payer: Self-pay | Admitting: Emergency Medicine

## 2019-09-03 DIAGNOSIS — I10 Essential (primary) hypertension: Secondary | ICD-10-CM

## 2019-09-03 DIAGNOSIS — F17210 Nicotine dependence, cigarettes, uncomplicated: Secondary | ICD-10-CM | POA: Diagnosis not present

## 2019-09-03 DIAGNOSIS — K047 Periapical abscess without sinus: Secondary | ICD-10-CM | POA: Insufficient documentation

## 2019-09-03 DIAGNOSIS — R600 Localized edema: Secondary | ICD-10-CM | POA: Diagnosis present

## 2019-09-03 DIAGNOSIS — R030 Elevated blood-pressure reading, without diagnosis of hypertension: Secondary | ICD-10-CM | POA: Insufficient documentation

## 2019-09-03 MED ORDER — BUPIVACAINE-EPINEPHRINE (PF) 0.5% -1:200000 IJ SOLN
1.8000 mL | Freq: Once | INTRAMUSCULAR | Status: AC
  Administered 2019-09-03: 23:00:00 1.8 mL
  Filled 2019-09-03: qty 1.8

## 2019-09-03 MED ORDER — CLINDAMYCIN HCL 150 MG PO CAPS: 450.0000 mg | ORAL_CAPSULE | Freq: Three times a day (TID) | ORAL | 0 refills | Status: DC

## 2019-09-03 MED ORDER — KETOROLAC TROMETHAMINE 60 MG/2ML IM SOLN
60.0000 mg | Freq: Once | INTRAMUSCULAR | Status: AC
  Administered 2019-09-03: 23:00:00 60 mg via INTRAMUSCULAR
  Filled 2019-09-03: qty 2

## 2019-09-03 MED ORDER — CLINDAMYCIN HCL 150 MG PO CAPS
450.0000 mg | ORAL_CAPSULE | Freq: Once | ORAL | Status: AC
  Administered 2019-09-03: 450 mg via ORAL
  Filled 2019-09-03: qty 3
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
## 2019-09-03 NOTE — ED Provider Notes (Signed)
[facility] EMERGENCY DEPARTMENT Provider Note   CSN: [id number] Arrival date & time: 09/03/19  2119     History Chief Complaint  Patient presents with  . Oral Swelling    Omar Moyer is a 29 y.o. male with a hx of no major medical problems presents to the Emergency Department complaining of gradual, persistent, progressively worsening swelling.  Patient reports it is significantly worsened today.  He is taking Tylenol and ibuprofen without relief.  He may get better.  Eating and drinking make the symptoms worse.  Denies fever, chills, headache, neck pain, neck stiffness.   The history is provided by the patient and medical records. No language interpreter was used.       History reviewed. No pertinent past medical history.  There are no problems to display for this patient.   Past Surgical History:  Procedure Laterality Date  . ABDOMINAL SURGERY     hernia repair  . BONE EXCISION Left 07/18/2019   Procedure: LEFT INDEX FINGER LESION CURRETTAGE AND BONE GRAFTING FROM DISTAL RADIUS;  Surgeon: Milly Jakob, MD;  Location: [facility];  Service: Orthopedics;  Laterality: Left;  . WISDOM TOOTH EXTRACTION         Family History  Problem Relation Age of Onset  . Diabetes Father     Social History   Tobacco Use  . Smoking status: Current Every Day Smoker    Types: Cigarettes  . Smokeless tobacco: Current User  . Tobacco comment: 2-3x per week  Substance Use Topics  . Alcohol use: Yes    Comment: occassional  . Drug use: No    Home Medications Prior to Admission medications   Medication Sig Start Date End Date Taking? Authorizing Provider  acetaminophen (TYLENOL) 325 MG tablet Take 2 tablets (650 mg total) by mouth every 6 (six) hours. Patient not taking: Reported on 09/03/2019 04/22/19   Milly Jakob, MD  clindamycin (CLEOCIN) 150 MG capsule Take 3 capsules (450 mg total) by mouth 3 (three) times daily. 09/03/19    Caragh Gasper, Jarrett Soho, PA-C  ibuprofen (ADVIL) 200 MG tablet Take 3 tablets (600 mg total) by mouth every 6 (six) hours. Patient not taking: Reported on 09/03/2019 07/18/19   Milly Jakob, MD  oxyCODONE (ROXICODONE) 5 MG immediate release tablet Take 1 tablet (5 mg total) by mouth every 6 (six) hours as needed for severe pain. Patient not taking: Reported on 09/03/2019 07/18/19   Milly Jakob, MD    Allergies    Patient has no known allergies.  Review of Systems   Review of Systems  Constitutional: Negative for appetite change, chills and fever.  HENT: Positive for dental problem and facial swelling. Negative for drooling, ear pain and trouble swallowing.   Eyes: Negative for pain and redness.  Gastrointestinal: Negative for nausea and vomiting.  Musculoskeletal: Negative for neck pain and neck stiffness.  Skin: Negative for color change.  Neurological: Negative for headaches.  All other systems reviewed and are negative.   Physical Exam Updated Vital Signs BP (!) 145/85 (BP Location: Left Arm)   Pulse (!) 56   Temp 98.9 F (37.2 C) (Oral)   Resp 17   Ht 6\' 3"  (1.905 m)   Wt 93.1 kg   SpO2 100%   BMI 25.65 kg/m   Physical Exam Vitals and nursing note reviewed.  Constitutional:      General: He is not in acute distress.    Appearance: He is well-developed.  HENT:  Head: Normocephalic.     Jaw: Swelling present. No trismus.     Right Ear: Tympanic membrane, ear canal and external ear normal.     Left Ear: Tympanic membrane, ear canal and external ear normal.     Nose: Nose normal.     Right Sinus: No maxillary sinus tenderness or frontal sinus tenderness.     Left Sinus: No maxillary sinus tenderness or frontal sinus tenderness.     Mouth/Throat:     Mouth: Mucous membranes are moist. No lacerations or oral lesions.     Dentition: Abnormal dentition. Dental tenderness, gingival swelling, dental caries and dental abscesses present.     Pharynx: Uvula midline. No  oropharyngeal exudate, posterior oropharyngeal erythema or uvula swelling.     Tonsils: No tonsillar abscesses.   Eyes:     General: No scleral icterus.       Right eye: No discharge.        Left eye: No discharge.     Conjunctiva/sclera: Conjunctivae normal.  Neck:     Comments: No stridor Handling secretions without difficulty No nuchal rigidity No cervical lymphadenopathy Cardiovascular:     Rate and Rhythm: Normal rate and regular rhythm.     Heart sounds: Normal heart sounds.  Pulmonary:     Effort: Pulmonary effort is normal. No respiratory distress.  Abdominal:     General: There is no distension.  Musculoskeletal:        General: Normal range of motion.     Cervical back: Normal range of motion and neck supple.  Lymphadenopathy:     Head:     Right side of head: Submandibular adenopathy present. No submental or tonsillar adenopathy.     Left side of head: No submental, submandibular or tonsillar adenopathy.     Cervical: No cervical adenopathy.  Skin:    General: Skin is warm and dry.  Neurological:     Mental Status: He is alert.     ED Results / Procedures / Treatments    Procedures .Marland KitchenIncision and Drainage  Date/Time: 09/03/2019 11:04 PM Performed by: Abigail Butts, PA-C Authorized by: Abigail Butts, PA-C   Consent:    Consent obtained:  Verbal   Consent given by:  Patient   Risks discussed:  Bleeding, incomplete drainage, pain and damage to other organs   Alternatives discussed:  No treatment Universal protocol:    Procedure explained and questions answered to patient or proxy's satisfaction: yes     Relevant documents present and verified: yes     Test results available and properly labeled: yes     Imaging studies available: yes     Required blood products, implants, devices, and special equipment available: yes     Site/side marked: yes     Immediately prior to procedure a time out was called: yes     Patient identity confirmed:   Verbally with patient Location:    Type:  Abscess   Location:  Mouth   Mouth location:  Alveolar process Anesthesia (see MAR for exact dosages):    Anesthesia method:  Local infiltration   Local anesthetic:  Bupivacaine 0.5% WITH epi Procedure type:    Complexity:  Complex Procedure details:    Incision types:  Stab incision   Incision depth:  Subcutaneous   Scalpel blade:  11   Wound management:  Probed and deloculated, irrigated with saline and extensive cleaning   Drainage:  Purulent   Drainage amount:  Moderate   Wound treatment:  Wound left  open Post-procedure details:    Patient tolerance of procedure:  Tolerated well, no immediate complications   (including critical care time)  Medications Ordered in ED Medications  bupivacaine-epinephrine (MARCAINE W/ EPI) 0.5% -1:200000 injection 1.8 mL (1.8 mLs Infiltration Given 09/03/19 2257)  ketorolac (TORADOL) injection 60 mg (60 mg Intramuscular Given 09/03/19 2254)  clindamycin (CLEOCIN) capsule 450 mg (450 mg Oral Given 09/03/19 2258)    ED Course  I have reviewed the triage vital signs and the nursing notes.  Pertinent labs & imaging results that were available during my care of the patient were reviewed by me and considered in my medical decision making (see chart for details).  Clinical Course as of Sep 02 2333  Sat Sep 03, 2019  2334 HTN noted.  Pt will need PCP follow-up.  No chest pain or SOB.  BP(!): 145/85 [HM]    Clinical Course User Index [HM] Lashon Hillier, Gwenlyn Perking   MDM Rules/Calculators/A&P                       Patient with toothache.  Gross abscess palpable fluctuance.  I&D without complications.  Exam unconcerning for Ludwig's angina or spread of infection.  Will treat with clindamycin and anti-inflammatories medicine.  Urged patient to follow-up with dentist.  Also discussed reasons to return to the ED.  Final Clinical Impression(s) / ED Diagnoses Final diagnoses:  Dental abscess  Dental infection   Hypertension, unspecified type    Rx / DC Orders ED Discharge Orders         Ordered    clindamycin (CLEOCIN) 150 MG capsule  3 times daily     09/03/19 2332           Oliviya Gilkison, Gwenlyn Perking 09/03/19 2335    Maudie Flakes, MD 09/07/19 [phone]

## 2019-09-03 NOTE — Discharge Instructions (Addendum)
1. Medications: Clindamycin, alternate tylenol and ibuprofen for pain control, usual home medications 2. Treatment: rest, drink plenty of fluids, take medications as prescribed 3. Follow Up: Please followup with dentistry within 1 week for further evaluation of your dental infection.  Please follow-up with your PCP for recheck of your blood pressure; Return to the ER for high fevers, difficulty breathing, difficulty swallowing or other concerning symptoms

## 2019-09-07 ENCOUNTER — Encounter: Payer: Self-pay | Admitting: *Deleted

## 2019-09-27 ENCOUNTER — Emergency Department (HOSPITAL_COMMUNITY)
Admission: EM | Admit: 2019-09-27 | Discharge: 2019-09-27 | Discharge status: Against Medical Advice | Payer: PRIVATE HEALTH INSURANCE

## 2019-09-27 DIAGNOSIS — K047 Periapical abscess without sinus: Secondary | ICD-10-CM | POA: Insufficient documentation

## 2019-09-27 DIAGNOSIS — F17210 Nicotine dependence, cigarettes, uncomplicated: Secondary | ICD-10-CM | POA: Insufficient documentation

## 2019-09-27 DIAGNOSIS — K0889 Other specified disorders of teeth and supporting structures: Secondary | ICD-10-CM | POA: Diagnosis present

## 2019-09-27 NOTE — ED Notes (Signed)
Pt stated he was leaving due to the wait time. Doesn't want to be triaged

## 2019-09-28 ENCOUNTER — Emergency Department (HOSPITAL_COMMUNITY)
Admission: EM | Admit: 2019-09-28 | Discharge: 2019-09-28 | Disposition: A | Discharge status: Home / Self Care | Payer: 59 | Attending: Emergency Medicine | Admitting: Emergency Medicine

## 2019-09-28 ENCOUNTER — Encounter (HOSPITAL_COMMUNITY): Payer: Self-pay

## 2019-09-28 DIAGNOSIS — K047 Periapical abscess without sinus: Secondary | ICD-10-CM

## 2019-09-28 MED ORDER — KETOROLAC TROMETHAMINE 15 MG/ML IJ SOLN
15.0000 mg | Freq: Once | INTRAMUSCULAR | Status: AC
  Administered 2019-09-28: 15 mg via INTRAMUSCULAR
  Filled 2019-09-28: qty 1

## 2019-09-28 MED ORDER — CLINDAMYCIN HCL 150 MG PO CAPS: 300.0000 mg | ORAL_CAPSULE | Freq: Three times a day (TID) | ORAL | 0 refills | Status: DC

## 2019-09-28 MED ORDER — BUPIVACAINE-EPINEPHRINE (PF) 0.5% -1:200000 IJ SOLN
1.8000 mL | Freq: Once | INTRAMUSCULAR | Status: AC
  Administered 2019-09-28: 1.8 mL
  Filled 2019-09-28: qty 1.8

## 2019-09-28 NOTE — ED Triage Notes (Signed)
Arrived POV from home. Patient reports abscess of right lower mouth. Patient seen at [facility] ED 2 weeks ago for same. Patient was on antibiotic regimen, but did not complete because he says the swelling went down and pain subsided. Now abscess is worse than it was initially.

## 2019-09-28 NOTE — ED Notes (Signed)
Patient has swelling and pain to the right lower side of face.

## 2019-09-28 NOTE — ED Provider Notes (Signed)
[facility] DEPT Provider Note   CSN: [id number] Arrival date & time: 09/27/19  2319     History Chief Complaint  Patient presents with  . Abscess    Omar Moyer is a 29 y.o. male.  HPI     This is a 30 year old male who presents with recurrent dental abscess.  Patient reports that he was seen and evaluated approximately 2 weeks ago for dental pain and swelling.  At that time he had abscess drained and was placed on antibiotics.  He states he only took antibiotics for 3 days because his symptoms improved.  However over the last 24 to 48 hours he has had recurrent swelling of the right lower jaw and increasing pain.  Currently he rates his pain at 10 out of 10.  He did restart antibiotics and reports that he has taken 3 doses at this time without significant improvement.  He does not have a dentist.  He has not taken anything for his pain.  He denies any difficulty swallowing or fevers.  Chart reviewed.  Patient did have an I&D of dental abscess.  He was discharged on 450 mg clindamycin 3 times daily.  He only completed 3 days worth.  History reviewed. No pertinent past medical history.  There are no problems to display for this patient.   Past Surgical History:  Procedure Laterality Date  . ABDOMINAL SURGERY     hernia repair  . BONE EXCISION Left 07/18/2019   Procedure: LEFT INDEX FINGER LESION CURRETTAGE AND BONE GRAFTING FROM DISTAL RADIUS;  Surgeon: Milly Jakob, MD;  Location: [facility];  Service: Orthopedics;  Laterality: Left;  . WISDOM TOOTH EXTRACTION         Family History  Problem Relation Age of Onset  . Diabetes Father     Social History   Tobacco Use  . Smoking status: Current Every Day Smoker    Types: Cigarettes  . Smokeless tobacco: Current User  . Tobacco comment: 2-3x per week  Substance Use Topics  . Alcohol use: Yes    Comment: occassional  . Drug use: No    Home Medications Prior to  Admission medications   Medication Sig Start Date End Date Taking? Authorizing Provider  acetaminophen (TYLENOL) 325 MG tablet Take 2 tablets (650 mg total) by mouth every 6 (six) hours. Patient not taking: Reported on 09/03/2019 04/22/19   Milly Jakob, MD  clindamycin (CLEOCIN) 150 MG capsule Take 2 capsules (300 mg total) by mouth 3 (three) times daily. 09/28/19   Quatisha Zylka, Barbette Hair, MD  ibuprofen (ADVIL) 200 MG tablet Take 3 tablets (600 mg total) by mouth every 6 (six) hours. Patient not taking: Reported on 09/03/2019 07/18/19   Milly Jakob, MD  oxyCODONE (ROXICODONE) 5 MG immediate release tablet Take 1 tablet (5 mg total) by mouth every 6 (six) hours as needed for severe pain. Patient not taking: Reported on 09/03/2019 07/18/19   Milly Jakob, MD    Allergies    Patient has no known allergies.  Review of Systems   Review of Systems  Constitutional: Negative for fever.  HENT: Positive for dental problem and facial swelling. Negative for trouble swallowing.   Respiratory: Negative for shortness of breath.   Cardiovascular: Negative for chest pain.  All other systems reviewed and are negative.   Physical Exam Updated Vital Signs BP 138/77 (BP Location: Left Arm)   Pulse 92   Temp 98.9 F (37.2 C) (Oral)   Resp 18  Ht 1.88 m (6\' 2" )   Wt 93 kg   SpO2 98%   BMI 26.32 kg/m   Physical Exam Vitals and nursing note reviewed.  Constitutional:      Appearance: He is well-developed. He is not ill-appearing.     Comments: ABCs intact  HENT:     Head: Normocephalic and atraumatic.     Comments: Facial swelling noted over the right mandible area, no overlying skin changes    Mouth/Throat:     Dentition: Dental tenderness present.      Comments: No trismus, no fullness noted under the tongue, tenderness palpation of the right lower gum with obvious palpable dental abscess as annotated Eyes:     Pupils: Pupils are equal, round, and reactive to light.  Cardiovascular:      Rate and Rhythm: Normal rate and regular rhythm.  Pulmonary:     Effort: Pulmonary effort is normal. No respiratory distress.  Musculoskeletal:     Cervical back: Neck supple.  Lymphadenopathy:     Cervical: No cervical adenopathy.  Skin:    General: Skin is warm and dry.  Neurological:     Mental Status: He is alert and oriented to person, place, and time.  Psychiatric:        Mood and Affect: Mood normal.     ED Results / Procedures / Treatments   Labs (all labs ordered are listed, but only abnormal results are displayed) Labs Reviewed - No data to display  EKG None  Radiology No results found.  Procedures Dental Block  Date/Time: 09/28/2019 3:51 AM Performed by: Merryl Hacker, MD Authorized by: Merryl Hacker, MD   Consent:    Consent obtained:  Verbal   Consent given by:  Patient   Risks discussed:  Infection and pain   Alternatives discussed:  No treatment Universal protocol:    Procedure explained and questions answered to patient or proxy's satisfaction: yes     Immediately prior to procedure, a time out was called: yes     Patient identity confirmed:  Verbally with patient and arm band Indications:    Indications: dental abscess   Location:    Block type:  Inferior alveolar   Laterality:  Right Procedure details (see MAR for exact dosages):    Syringe type:  Aspirating dental syringe   Needle gauge:  27 G   Anesthetic injected:  Bupivacaine 0.5% WITH epi   Injection procedure:  Anatomic landmarks identified, introduced needle, incremental injection and anatomic landmarks palpated Post-procedure details:    Outcome:  Anesthesia achieved   Patient tolerance of procedure:  Tolerated well, no immediate complications .Marland KitchenIncision and Drainage  Date/Time: 09/28/2019 3:52 AM Performed by: Merryl Hacker, MD Authorized by: Merryl Hacker, MD   Consent:    Consent obtained:  Verbal   Consent given by:  Patient   Risks discussed:  Bleeding,  incomplete drainage and pain   Alternatives discussed:  No treatment Location:    Type:  Abscess   Size:  0.5 x 0.5 cm   Location:  Mouth   Mouth location:  Alveolar process Anesthesia (see MAR for exact dosages):    Anesthesia method:  Nerve block   Block location:  See above Procedure type:    Complexity:  Simple Procedure details:    Incision types:  Stab incision   Scalpel blade:  11   Drainage:  Purulent and bloody   Drainage amount:  Moderate   Packing materials:  None Post-procedure details:  Patient tolerance of procedure:  Tolerated well, no immediate complications   (including critical care time)  Medications Ordered in ED Medications  bupivacaine-epinephrine (MARCAINE W/ EPI) 0.5% -1:200000 injection 1.8 mL (1.8 mLs Infiltration Given 09/28/19 0324)  ketorolac (TORADOL) 15 MG/ML injection 15 mg (15 mg Intramuscular Given 09/28/19 0323)    ED Course  I have reviewed the triage vital signs and the nursing notes.  Pertinent labs & imaging results that were available during my care of the patient were reviewed by me and considered in my medical decision making (see chart for details).    MDM Rules/Calculators/A&P                       Patient presents with recurrent dental abscess.  He did not finish antibiotics and has not seen a dentist.  He has evidence of a palpable abscess.  No evidence of deep space infection and exam is otherwise reassuring without evidence of Ludwick's.  Dental block was performed and incision and drainage was successful with purulent material extracted.  Discussed with patient that it is very important for him to continue a full course of antibiotics.  I did decrease the dosage of clindamycin to 300 mg 3 times daily in order to avoid potential complications of C. Difficile.  He was given dental resources.  Patient was given strict return precautions.  After history, exam, and medical workup I feel the patient has been appropriately medically  screened and is safe for discharge home. Pertinent diagnoses were discussed with the patient. Patient was given return precautions.   Final Clinical Impression(s) / ED Diagnoses Final diagnoses:  Dental abscess    Rx / DC Orders ED Discharge Orders         Ordered    clindamycin (CLEOCIN) 150 MG capsule  3 times daily     09/28/19 0349           Linh Johannes, Barbette Hair, MD 09/28/19 [phone]

## 2019-09-28 NOTE — Discharge Instructions (Addendum)
You were seen today for recurrent dental abscess.  It is very important that you complete antibiotics and see a dentist.  I modified your antibiotic dosing to 300 mg 3 times a day for 10 days.  If you develop difficulty swallowing, recurrent abscess, fevers or skin changes you should be reevaluated.

## 2019-11-07 ENCOUNTER — Other Ambulatory Visit: Payer: Self-pay

## 2019-11-07 ENCOUNTER — Encounter (HOSPITAL_COMMUNITY): Payer: Self-pay

## 2019-11-07 ENCOUNTER — Emergency Department (HOSPITAL_COMMUNITY)
Admission: EM | Admit: 2019-11-07 | Discharge: 2019-11-07 | Disposition: A | Discharge status: Home / Self Care | Payer: 59 | Attending: Emergency Medicine | Admitting: Emergency Medicine

## 2019-11-07 DIAGNOSIS — K047 Periapical abscess without sinus: Secondary | ICD-10-CM | POA: Insufficient documentation

## 2019-11-07 DIAGNOSIS — F17210 Nicotine dependence, cigarettes, uncomplicated: Secondary | ICD-10-CM | POA: Insufficient documentation

## 2019-11-07 DIAGNOSIS — R600 Localized edema: Secondary | ICD-10-CM | POA: Diagnosis not present

## 2019-11-07 DIAGNOSIS — K0889 Other specified disorders of teeth and supporting structures: Secondary | ICD-10-CM | POA: Diagnosis not present

## 2019-11-07 MED ORDER — CLINDAMYCIN HCL 150 MG PO CAPS: 300.0000 mg | ORAL_CAPSULE | Freq: Three times a day (TID) | ORAL | 0 refills | Status: DC

## 2019-11-07 NOTE — ED Notes (Signed)
Patient verbalizes understanding of discharge instructions . Opportunity for questions and answers were provided . Armband removed by staff ,Pt discharged from ED. W/C  offered at D/C  and Declined W/C at D/C and was escorted to lobby by RN.  

## 2019-11-07 NOTE — Discharge Instructions (Addendum)
Take Clindamycin 300mg  three times daily for swelling and infection - an additional amount has been sent to your pharmacy to take for a total of 10 days Take Ibuprofen 600mg  three times daily for pain and inflammation Follow up with either [facility] or Dr. Geralynn Ochs

## 2019-11-07 NOTE — ED Provider Notes (Signed)
[facility] EMERGENCY DEPARTMENT Provider Note   CSN: [id number] Arrival date & time: 11/07/19  0747     History Chief Complaint  Patient presents with  . Dental Pain  . Abscess    Omar Moyer is a 29 y.o. male who presents with dental pain and possible dental abscess. He states that he is here for the same thing. He has been coming about once a month for a recurrent dental abscess. It is over the left lower molar. He had the same thing on the right side and eventually had to get that pulled. Since the past day or two he started to develop a toothache. Overnight it became very painful and he developed recurrent left lower jaw swelling. The past two times he was in the ED he had incision and drainage of the area and was put on clindamycin. Each time it improves and he stops his antibiotics early. He has about 20 pills left of the Clinda but is taking it inconsistently. [facility] but his appointment is on Wednesday and he got scared because of the swelling so he decided to come to the ED. He states he caught this one early because last time the pain was severe.  HPI     History reviewed. No pertinent past medical history.  There are no problems to display for this patient.   Past Surgical History:  Procedure Laterality Date  . ABDOMINAL SURGERY     hernia repair  . BONE EXCISION Left 07/18/2019   Procedure: LEFT INDEX FINGER LESION CURRETTAGE AND BONE GRAFTING FROM DISTAL RADIUS;  Surgeon: Milly Jakob, MD;  Location: [facility];  Service: Orthopedics;  Laterality: Left;  . WISDOM TOOTH EXTRACTION         Family History  Problem Relation Age of Onset  . Diabetes Father     Social History   Tobacco Use  . Smoking status: Current Every Day Smoker    Types: Cigarettes  . Smokeless tobacco: Current User  . Tobacco comment: 2-3x per week  Substance Use Topics  . Alcohol use: Yes    Comment: occassional    . Drug use: No    Home Medications Prior to Admission medications   Medication Sig Start Date End Date Taking? Authorizing Provider  acetaminophen (TYLENOL) 325 MG tablet Take 2 tablets (650 mg total) by mouth every 6 (six) hours. Patient not taking: Reported on 09/03/2019 04/22/19   Milly Jakob, MD  clindamycin (CLEOCIN) 150 MG capsule Take 2 capsules (300 mg total) by mouth 3 (three) times daily. 09/28/19   Horton, Barbette Hair, MD  ibuprofen (ADVIL) 200 MG tablet Take 3 tablets (600 mg total) by mouth every 6 (six) hours. Patient not taking: Reported on 09/03/2019 07/18/19   Milly Jakob, MD  oxyCODONE (ROXICODONE) 5 MG immediate release tablet Take 1 tablet (5 mg total) by mouth every 6 (six) hours as needed for severe pain. Patient not taking: Reported on 09/03/2019 07/18/19   Milly Jakob, MD    Allergies    Patient has no known allergies.  Review of Systems   Review of Systems  Constitutional: Negative for fever.  HENT: Positive for dental problem and facial swelling.     Physical Exam Updated Vital Signs BP 124/76 (BP Location: Right Arm)   Pulse (!) 51   Temp 98.1 F (36.7 C) (Oral)   Resp 16   Ht 6\' 2"  (1.88 m)   Wt 97.5 kg  SpO2 100%   BMI 27.60 kg/m   Physical Exam Vitals and nursing note reviewed.  Constitutional:      General: He is not in acute distress.    Appearance: Normal appearance. He is well-developed. He is not ill-appearing.  HENT:     Head: Normocephalic and atraumatic.     Ears:     Comments: Left lower molar has significant decay. There is erythema and very mild swelling of the lower gum line. No trismus or drooling. Eyes:     General: No scleral icterus.       Right eye: No discharge.        Left eye: No discharge.     Conjunctiva/sclera: Conjunctivae normal.     Pupils: Pupils are equal, round, and reactive to light.  Cardiovascular:     Rate and Rhythm: Normal rate.  Pulmonary:     Effort: Pulmonary effort is normal. No  respiratory distress.  Abdominal:     General: There is no distension.  Musculoskeletal:     Cervical back: Normal range of motion.  Skin:    General: Skin is warm and dry.  Neurological:     Mental Status: He is alert and oriented to person, place, and time.  Psychiatric:        Behavior: Behavior normal.     ED Results / Procedures / Treatments   Labs (all labs ordered are listed, but only abnormal results are displayed) Labs Reviewed - No data to display  EKG None  Radiology No results found.  Procedures .Marland KitchenIncision and Drainage  Date/Time: 11/07/2019 10:41 AM Performed by: Recardo Evangelist, PA-C Authorized by: Recardo Evangelist, PA-C   Consent:    Consent obtained:  Verbal   Consent given by:  Patient   Risks discussed:  Bleeding, incomplete drainage, pain and damage to other organs   Alternatives discussed:  No treatment Universal protocol:    Procedure explained and questions answered to patient or proxy's satisfaction: yes     Relevant documents present and verified: yes     Test results available and properly labeled: yes     Imaging studies available: yes     Required blood products, implants, devices, and special equipment available: yes     Site/side marked: yes     Immediately prior to procedure a time out was called: yes     Patient identity confirmed:  Verbally with patient Location:    Type:  Abscess   Size:  .25cm x .25cm   Location: left lower dental. Anesthesia (see MAR for exact dosages):    Anesthesia method:  Topical application   Topical anesthetic:  Benzocaine gel Procedure type:    Complexity:  Simple Procedure details:    Incision types:  Stab incision   Incision depth:  Dermal   Scalpel blade:  11   Drainage:  Bloody   Drainage amount:  Scant   Wound treatment:  Wound left open   Packing materials:  None Post-procedure details:    Patient tolerance of procedure:  Tolerated well, no immediate complications   (including  critical care time)    Medications Ordered in ED Medications - No data to display  ED Course  I have reviewed the triage vital signs and the nursing notes.  Pertinent labs & imaging results that were available during my care of the patient were reviewed by me and considered in my medical decision making (see chart for details).  29 year old male present with dental pain  and facial swelling. He has been seen in the ED three times and had I&D of the area. He never completes the course of antibiotics however and abscess recurs. He does have mild facial swelling on exam and mild swelling of the left lower gumline. The area appears too small to I&D today but pt is insistent that he wants to attempt procedure. Topical benzocaine was applied to the area and a stab incision was made but drainage was only bloody. He has ~20 pills of the Clindamycin left. Advised he needs to take the full course and will rx an additional 18 pills to  Get him to a 10 day course. He has an appointment with a dentist on Wednesday and was encouraged to keep this for a recheck. He was also give info for the dentist on call today. Advised return if worsening.  MDM Rules/Calculators/A&P                       Final Clinical Impression(s) / ED Diagnoses Final diagnoses:  Pain, dental    Rx / DC Orders ED Discharge Orders    None       Recardo Evangelist, PA-C 11/07/19 [place], [place], DO 11/07/19 1200

## 2019-11-07 NOTE — ED Triage Notes (Signed)
Pt reports dental abscess to right lower teeth for the past 2 days, hx of same. States he has had trouble getting into a dental office. Swelling noted to right lower jaw.

## 2021-01-08 ENCOUNTER — Other Ambulatory Visit: Payer: Self-pay

## 2021-01-08 ENCOUNTER — Emergency Department (HOSPITAL_COMMUNITY): Payer: 59

## 2021-01-08 ENCOUNTER — Encounter (HOSPITAL_COMMUNITY): Payer: Self-pay | Admitting: *Deleted

## 2021-01-08 ENCOUNTER — Emergency Department (HOSPITAL_COMMUNITY)
Admission: EM | Admit: 2021-01-08 | Discharge: 2021-01-08 | Disposition: A | Discharge status: Home / Self Care | Payer: 59 | Attending: Emergency Medicine | Admitting: Emergency Medicine

## 2021-01-08 DIAGNOSIS — S199XXA Unspecified injury of neck, initial encounter: Secondary | ICD-10-CM | POA: Diagnosis present

## 2021-01-08 DIAGNOSIS — S129XXA Fracture of neck, unspecified, initial encounter: Secondary | ICD-10-CM

## 2021-01-08 DIAGNOSIS — R519 Headache, unspecified: Secondary | ICD-10-CM | POA: Diagnosis not present

## 2021-01-08 DIAGNOSIS — Y92410 Unspecified street and highway as the place of occurrence of the external cause: Secondary | ICD-10-CM | POA: Diagnosis not present

## 2021-01-08 DIAGNOSIS — R10817 Generalized abdominal tenderness: Secondary | ICD-10-CM | POA: Insufficient documentation

## 2021-01-08 DIAGNOSIS — F17210 Nicotine dependence, cigarettes, uncomplicated: Secondary | ICD-10-CM | POA: Insufficient documentation

## 2021-01-08 DIAGNOSIS — R0789 Other chest pain: Secondary | ICD-10-CM | POA: Diagnosis not present

## 2021-01-08 LAB — BASIC METABOLIC PANEL
Anion gap: 10 (ref 5–15)
BUN: 6 mg/dL (ref 6–20)
CO2: 27 mmol/L (ref 22–32)
Calcium: 9.6 mg/dL (ref 8.9–10.3)
Chloride: 103 mmol/L (ref 98–111)
Creatinine, Ser: 1.15 mg/dL (ref 0.61–1.24)
GFR, Estimated: >60 mL/min (ref >60)
Glucose, Bld: 96 mg/dL (ref 70–99)
Potassium: 4 mmol/L (ref 3.5–5.1)
Sodium: 140 mmol/L (ref 135–145)

## 2021-01-08 LAB — CBC WITH DIFFERENTIAL/PLATELET
Abs Immature Granulocytes: 0.03 10*3/uL (ref 0.00–0.07)
Basophils Absolute: 0 10*3/uL (ref 0.0–0.1)
Basophils Relative: 0 %
Eosinophils Absolute: 0.1 10*3/uL (ref 0.0–0.5)
Eosinophils Relative: 1 %
HCT: 45.2 % (ref 39.0–52.0)
Hemoglobin: 15.4 g/dL (ref 13.0–17.0)
Immature Granulocytes: 0 %
Lymphocytes Relative: 17 %
Lymphs Abs: 1.6 10*3/uL (ref 0.7–4.0)
MCH: 31.9 pg (ref 26.0–34.0)
MCHC: 34.1 g/dL (ref 30.0–36.0)
MCV: 93.6 fL (ref 80.0–100.0)
Monocytes Absolute: 0.6 10*3/uL (ref 0.1–1.0)
Monocytes Relative: 7 %
Neutro Abs: 7.1 10*3/uL (ref 1.7–7.7)
Neutrophils Relative %: 75 %
Platelets: 193 10*3/uL (ref 150–400)
RBC: 4.83 MIL/uL (ref 4.22–5.81)
RDW: 12.5 % (ref 11.5–15.5)
WBC: 9.4 10*3/uL (ref 4.0–10.5)
nRBC: 0 % (ref 0.0–0.2)

## 2021-01-08 LAB — HEPATIC FUNCTION PANEL
ALT: 30 U/L (ref 0–44)
AST: 48 U/L — ABNORMAL HIGH (ref 15–41)
Albumin: 4.4 g/dL (ref 3.5–5.0)
Alkaline Phosphatase: 63 U/L (ref 38–126)
Bilirubin, Direct: 0.2 mg/dL (ref 0.0–0.2)
Indirect Bilirubin: 1.4 mg/dL — ABNORMAL HIGH (ref 0.3–0.9)
Total Bilirubin: 1.6 mg/dL — ABNORMAL HIGH (ref 0.3–1.2)
Total Protein: 7.2 g/dL (ref 6.5–8.1)

## 2021-01-08 MED ORDER — SODIUM CHLORIDE 0.9 % IV BOLUS
1000.0000 mL | Freq: Once | INTRAVENOUS | Status: AC
  Administered 2021-01-08: 1000 mL via INTRAVENOUS

## 2021-01-08 MED ORDER — IOHEXOL 350 MG/ML SOLN
50.0000 mL | Freq: Once | INTRAVENOUS | Status: AC | PRN
  Administered 2021-01-08: 50 mL via INTRAVENOUS

## 2021-01-08 MED ORDER — IOHEXOL 300 MG/ML  SOLN
100.0000 mL | Freq: Once | INTRAMUSCULAR | Status: AC | PRN
  Administered 2021-01-08: 100 mL via INTRAVENOUS

## 2021-01-08 MED ORDER — OXYCODONE-ACETAMINOPHEN 5-325 MG PO TABS: 1.0000 | ORAL_TABLET | Freq: Four times a day (QID) | ORAL | 0 refills | Status: AC | PRN

## 2021-01-08 MED ORDER — MORPHINE SULFATE (PF) 4 MG/ML IV SOLN
4.0000 mg | Freq: Once | INTRAVENOUS | Status: AC
  Administered 2021-01-08: 4 mg via INTRAVENOUS
  Filled 2021-01-08: qty 1

## 2021-01-08 MED ORDER — ONDANSETRON HCL 4 MG/2ML IJ SOLN
4.0000 mg | Freq: Once | INTRAMUSCULAR | Status: AC
  Administered 2021-01-08: 4 mg via INTRAVENOUS
  Filled 2021-01-08: qty 2
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
## 2021-01-08 NOTE — ED Notes (Signed)
Patient transported to CT 
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
## 2021-03-23 ENCOUNTER — Encounter (HOSPITAL_COMMUNITY): Payer: Self-pay | Admitting: Emergency Medicine

## 2021-03-23 ENCOUNTER — Other Ambulatory Visit: Payer: Self-pay

## 2021-03-23 ENCOUNTER — Ambulatory Visit (HOSPITAL_COMMUNITY)
Admission: EM | Admit: 2021-03-23 | Discharge: 2021-03-23 | Disposition: A | Discharge status: Home / Self Care | Payer: 59 | Attending: Emergency Medicine | Admitting: Emergency Medicine

## 2021-03-23 DIAGNOSIS — Z20822 Contact with and (suspected) exposure to covid-19: Secondary | ICD-10-CM | POA: Insufficient documentation

## 2021-03-23 DIAGNOSIS — B349 Viral infection, unspecified: Secondary | ICD-10-CM | POA: Insufficient documentation

## 2021-03-23 DIAGNOSIS — R1110 Vomiting, unspecified: Secondary | ICD-10-CM | POA: Diagnosis not present

## 2021-03-23 DIAGNOSIS — Z79899 Other long term (current) drug therapy: Secondary | ICD-10-CM | POA: Insufficient documentation

## 2021-03-23 DIAGNOSIS — F17210 Nicotine dependence, cigarettes, uncomplicated: Secondary | ICD-10-CM | POA: Insufficient documentation

## 2021-03-23 DIAGNOSIS — R197 Diarrhea, unspecified: Secondary | ICD-10-CM | POA: Insufficient documentation

## 2021-03-23 LAB — SARS CORONAVIRUS 2 (TAT 6-24 HRS): SARS Coronavirus 2: NEGATIVE

## 2021-03-23 MED ORDER — PROMETHAZINE-DM 6.25-15 MG/5ML PO SYRP: 5.0000 mL | ORAL_SOLUTION | Freq: Four times a day (QID) | ORAL | 0 refills | Status: AC | PRN

## 2021-03-23 MED ORDER — ONDANSETRON 4 MG PO TBDP: 4.0000 mg | ORAL_TABLET | Freq: Three times a day (TID) | ORAL | 0 refills | Status: AC | PRN

## 2021-03-23 NOTE — Discharge Instructions (Signed)
We will contact you if your COVID test is positive.  Please quarantine while you wait for the results.  If your test is negative you may resume normal activities.  If your test is positive please continue to quarantine for at least 5 days from your symptom onset or until you are without a fever for at least 24 hours after the medications.  You can take the Zofran as needed for nausea and vomiting. You can take the promethazine as needed for cough.  Do not take this before driving as it can make you sleepy.  You can take Tylenol and/or Ibuprofen as needed for fever reduction and pain relief.   For cough: honey 1/2 to 1 teaspoon (you can dilute the honey in water or another fluid).  You can also use guaifenesin and dextromethorphan for cough. You can use a humidifier for chest congestion and cough.  If you don't have a humidifier, you can sit in the bathroom with the hot shower running.     For sore throat: try warm salt water gargles, cepacol lozenges, throat spray, warm tea or water with lemon/honey, popsicles or ice, or OTC cold relief medicine for throat discomfort.    For congestion: take a daily anti-histamine like Zyrtec, Claritin, and a oral decongestant, such as pseudoephedrine.  You can also use Flonase 1-2 sprays in each nostril daily.    It is important to stay hydrated: drink plenty of fluids (water, gatorade/powerade/pedialyte, juices, or teas) to keep your throat moisturized and help further relieve irritation/discomfort.   Return or go to the Emergency Department if symptoms worsen or do not improve in the next few days.

## 2021-03-23 NOTE — ED Triage Notes (Signed)
Pt is present today with fever, body aches, sore throat, cough, vomiting, and diarrhea. Pt states sx started two days ago.

## 2021-03-23 NOTE — ED Provider Notes (Signed)
[place]    CSN: [id number] Arrival date & time: 03/23/21  1005      History   Chief Complaint Chief Complaint  Patient presents with   Emesis   Fever    HPI Omar Moyer is a 30 y.o. male.   Patient here for evaluation of fever, congestion, vomiting, and diarrhea that has been ongoing for the past several days.  Reports concern for possible COVID but denies any known exposures.  Patient does work as a Education officer, community.  Has not taken any OTC medications or treatments.  Denies any trauma, injury, or other precipitating event.  Denies any specific alleviating or aggravating factors.  Denies any chest pain, shortness of breath, numbness, tingling, weakness, abdominal pain, or headaches.    The history is provided by the patient.  Emesis Associated symptoms: cough, diarrhea, fever and myalgias   Fever Associated symptoms: congestion, cough, diarrhea, myalgias and vomiting    History reviewed. No pertinent past medical history.  There are no problems to display for this patient.   Past Surgical History:  Procedure Laterality Date   ABDOMINAL SURGERY     hernia repair   BONE EXCISION Left 07/18/2019   Procedure: LEFT INDEX FINGER LESION CURRETTAGE AND BONE GRAFTING FROM DISTAL RADIUS;  Surgeon: Milly Jakob, MD;  Location: [place];  Service: Orthopedics;  Laterality: Left;   WISDOM TOOTH EXTRACTION         Home Medications    Prior to Admission medications   Medication Sig Start Date End Date Taking? Authorizing Provider  ondansetron (ZOFRAN ODT) 4 MG disintegrating tablet Take 1 tablet (4 mg total) by mouth every 8 (eight) hours as needed for nausea or vomiting. 03/23/21  Yes Pearson Forster, NP  promethazine-dextromethorphan (PROMETHAZINE-DM) 6.25-15 MG/5ML syrup Take 5 mLs by mouth 4 (four) times daily as needed for cough. 03/23/21  Yes Pearson Forster, NP  oxyCODONE-acetaminophen (PERCOCET/ROXICET) 5-325 MG tablet Take 1-2 tablets by  mouth every 6 (six) hours as needed for severe pain. 01/08/21   Petrucelli, Glynda Jaeger, PA-C    Family History Family History  Problem Relation Age of Onset   Diabetes Father     Social History Social History   Tobacco Use   Smoking status: Every Day    Types: Cigarettes   Smokeless tobacco: Current   Tobacco comments:    2-3x per week  Substance Use Topics   Alcohol use: Yes    Comment: occassional   Drug use: No     Allergies   Patient has no known allergies.   Review of Systems Review of Systems  Constitutional:  Positive for fever.  HENT:  Positive for congestion.   Respiratory:  Positive for cough.   Gastrointestinal:  Positive for diarrhea and vomiting.  Musculoskeletal:  Positive for myalgias.  All other systems reviewed and are negative.   Physical Exam Triage Vital Signs ED Triage Vitals [03/23/21 1034]  Enc Vitals Group     BP 124/65     Pulse Rate 68     Resp 18     Temp 99.4 F (37.4 C)     Temp src      SpO2 100 %     Weight      Height      Head Circumference      Peak Flow      Pain Score 8     Pain Loc      Pain Edu?  Excl. in [place]?    No data found.  Updated Vital Signs BP 124/65   Pulse 68   Temp 99.4 F (37.4 C)   Resp 18   SpO2 100%   Visual Acuity Right Eye Distance:   Left Eye Distance:   Bilateral Distance:    Right Eye Near:   Left Eye Near:    Bilateral Near:     Physical Exam Vitals and nursing note reviewed.  Constitutional:      General: He is not in acute distress.    Appearance: Normal appearance. He is not ill-appearing, toxic-appearing or diaphoretic.  HENT:     Head: Normocephalic and atraumatic.     Nose: Congestion and rhinorrhea present.     Mouth/Throat:     Pharynx: Posterior oropharyngeal erythema present. No oropharyngeal exudate.  Eyes:     Conjunctiva/sclera: Conjunctivae normal.  Cardiovascular:     Rate and Rhythm: Normal rate and regular rhythm.     Pulses: Normal pulses.      Heart sounds: Normal heart sounds.  Pulmonary:     Effort: Pulmonary effort is normal.     Breath sounds: Normal breath sounds.  Abdominal:     General: Abdomen is flat.  Musculoskeletal:        General: Normal range of motion.     Cervical back: Normal range of motion.  Skin:    General: Skin is warm and dry.  Neurological:     General: No focal deficit present.     Mental Status: He is alert and oriented to person, place, and time.  Psychiatric:        Mood and Affect: Mood normal.     UC Treatments / Results  Labs (all labs ordered are listed, but only abnormal results are displayed) Labs Reviewed  SARS CORONAVIRUS 2 (TAT 6-24 HRS)    EKG   Radiology No results found.  Procedures Procedures (including critical care time)  Medications Ordered in UC Medications - No data to display  Initial Impression / Assessment and Plan / UC Course  I have reviewed the triage vital signs and the nursing notes.  Pertinent labs & imaging results that were available during my care of the patient were reviewed by me and considered in my medical decision making (see chart for details).    Assessment negative for red flags or concerns.  Likely a viral illness.  COVID test pending.  Discussed current CDC guidelines for quarantine.  Work note given to patient.  May take Zofran as needed for nausea vomiting.  May use Promethazine DM as needed for cough.  Instructed not to take Promethazine DM prior to driving as it can cause drowsiness.  Tylenol and/or ibuprofen as needed.  Encourage fluids and rest.  Discussed conservative symptom management as described in discharge instructions.  Follow-up as needed Final Clinical Impressions(s) / UC Diagnoses   Final diagnoses:  Viral illness     Discharge Instructions      We will contact you if your COVID test is positive.  Please quarantine while you wait for the results.  If your test is negative you may resume normal activities.  If your  test is positive please continue to quarantine for at least 5 days from your symptom onset or until you are without a fever for at least 24 hours after the medications.  You can take the Zofran as needed for nausea and vomiting. You can take the promethazine as needed for cough.  Do not take  this before driving as it can make you sleepy.  You can take Tylenol and/or Ibuprofen as needed for fever reduction and pain relief.   For cough: honey 1/2 to 1 teaspoon (you can dilute the honey in water or another fluid).  You can also use guaifenesin and dextromethorphan for cough. You can use a humidifier for chest congestion and cough.  If you don't have a humidifier, you can sit in the bathroom with the hot shower running.     For sore throat: try warm salt water gargles, cepacol lozenges, throat spray, warm tea or water with lemon/honey, popsicles or ice, or OTC cold relief medicine for throat discomfort.    For congestion: take a daily anti-histamine like Zyrtec, Claritin, and a oral decongestant, such as pseudoephedrine.  You can also use Flonase 1-2 sprays in each nostril daily.    It is important to stay hydrated: drink plenty of fluids (water, gatorade/powerade/pedialyte, juices, or teas) to keep your throat moisturized and help further relieve irritation/discomfort.   Return or go to the Emergency Department if symptoms worsen or do not improve in the next few days.     ED Prescriptions     Medication Sig Dispense Auth. Provider   ondansetron (ZOFRAN ODT) 4 MG disintegrating tablet Take 1 tablet (4 mg total) by mouth every 8 (eight) hours as needed for nausea or vomiting. 20 tablet Pearson Forster, NP   promethazine-dextromethorphan (PROMETHAZINE-DM) 6.25-15 MG/5ML syrup Take 5 mLs by mouth 4 (four) times daily as needed for cough. 118 mL Pearson Forster, NP      PDMP not reviewed this encounter.   Pearson Forster, NP 03/23/21 1112

## 2021-12-15 IMAGING — CT CT ANGIO NECK
2 of 7 series · 8 of 33 positions shown · IV contrast (APPLIED)
Comparison: None.

CLINICAL DATA: Left C4 transverse process fracture

EXAM:
CT ANGIOGRAPHY NECK
TECHNIQUE: Multidetector CT imaging of the neck was performed using the
standard protocol during bolus administration of intravenous
contrast. Multiplanar CT image reconstructions and MIPs were
obtained to evaluate the vascular anatomy. Carotid stenosis
measurements (when applicable) are obtained utilizing NASCET
criteria, using the distal internal carotid diameter as the
denominator.
CONTRAST:  50mL OMNIPAQUE IOHEXOL 350 MG/ML SOLN

[Series 5: cta neck/head · axial · 0.54mm/px · z∈[-421,-337]mm · 2 of 126 slices shown]
[im 42/126  soft-tissue]
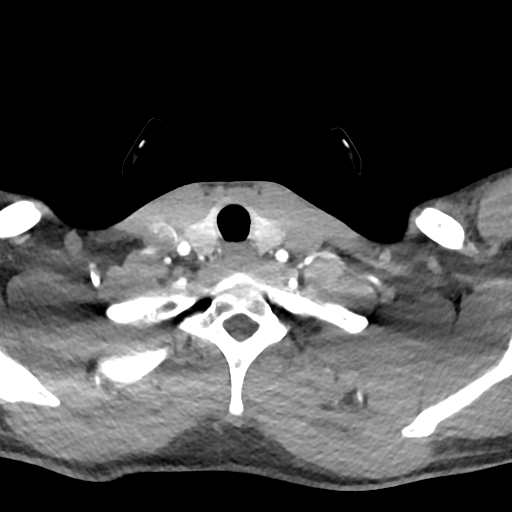
[im 84/126  soft-tissue]
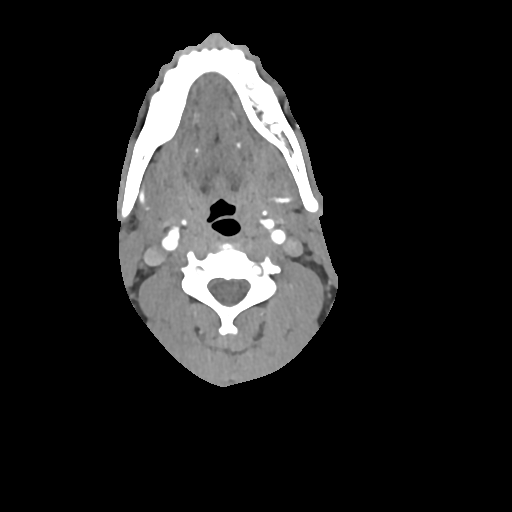

[Series 7: ax thins · axial · 0.39mm/px · z∈[-468,-288]mm · 6 of 252 slices shown]
[im 36/252  soft-tissue]
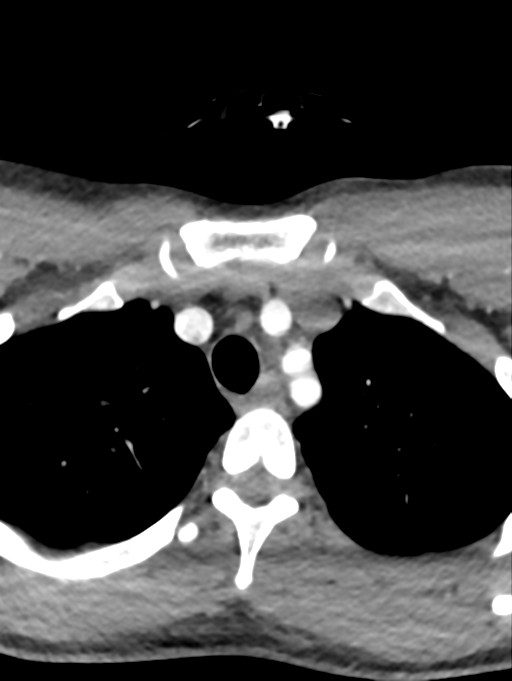
[im 72/252  bone]
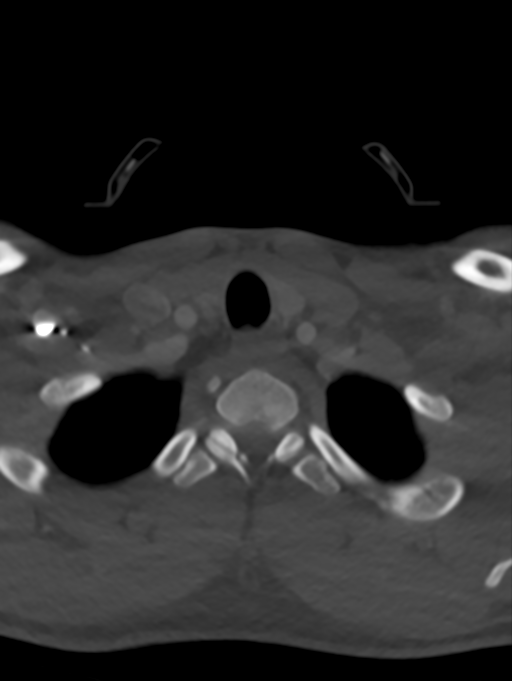
[im 108/252  soft-tissue]
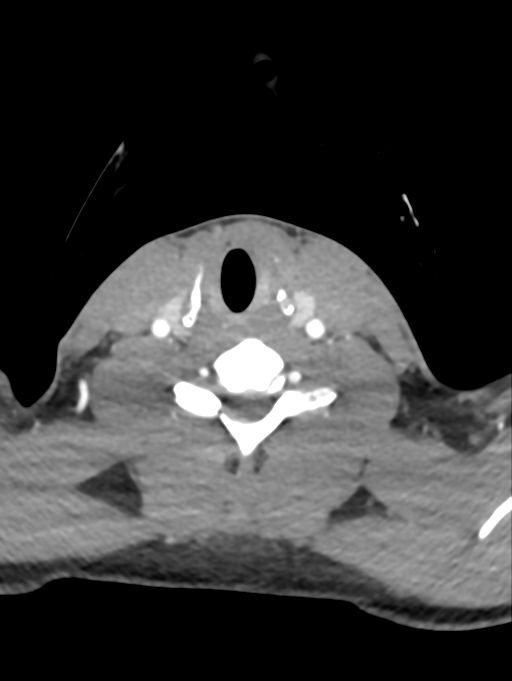
[im 144/252  bone]
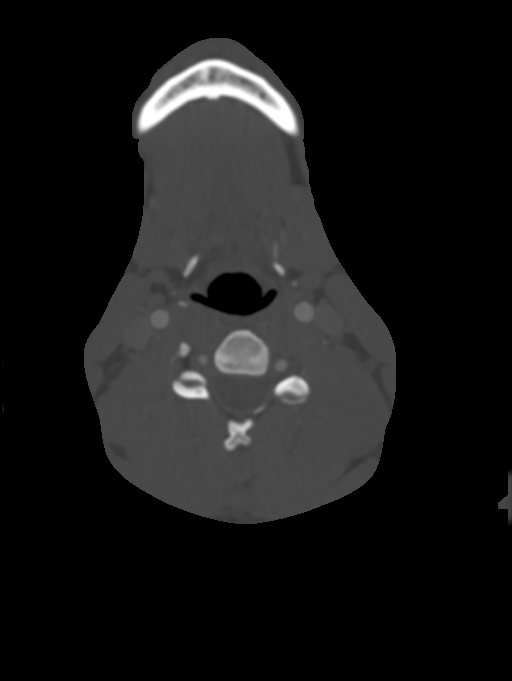
[im 180/252  soft-tissue]
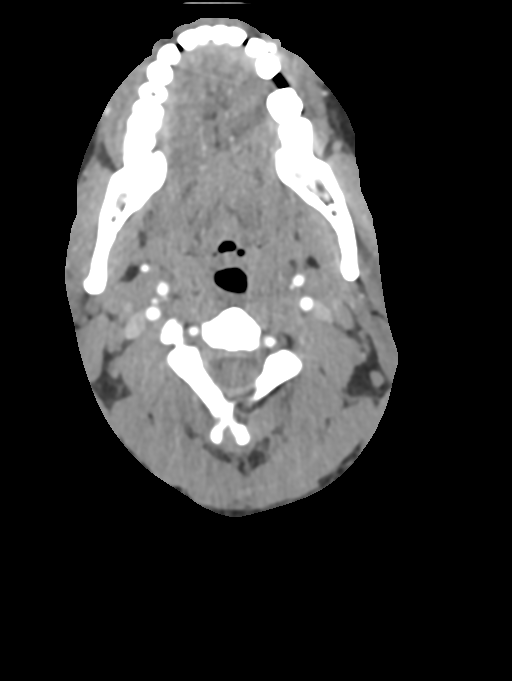
[im 216/252  bone]
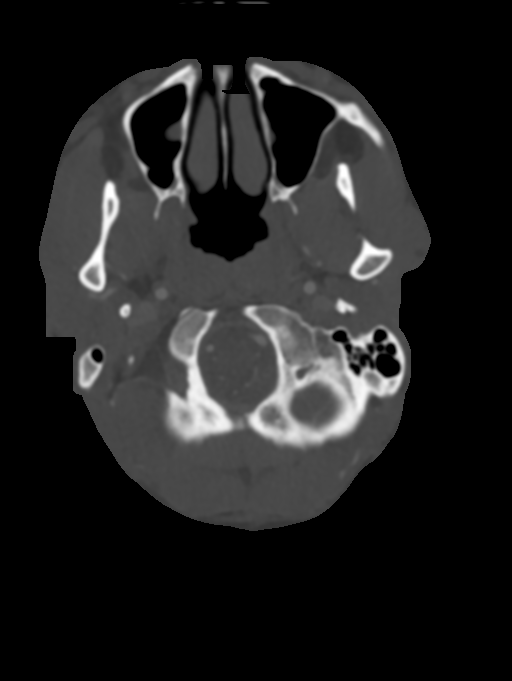

[8 of 33 positions shown; findings below may reference images not displayed]

FINDINGS: Skeleton: Left C4 transverse process fracture

Other neck: Normal pharynx, larynx and major salivary glands. No
cervical lymphadenopathy. Unremarkable thyroid gland.

Upper chest: No pneumothorax or pleural effusion. No nodules or
masses.

Aortic arch: There is no calcific atherosclerosis of the aortic
arch. There is no aneurysm, dissection or hemodynamically
significant stenosis of the visualized ascending aorta and aortic
arch. Conventional 3 vessel aortic branching pattern. The visualized
proximal subclavian arteries are widely patent.

Right carotid system:

--Common carotid artery: Widely patent origin without common carotid
artery dissection or aneurysm.

--Internal carotid artery: No dissection, occlusion or aneurysm. No
hemodynamically significant stenosis.

--External carotid artery: No acute abnormality.

Left carotid system:

--Common carotid artery: Widely patent origin without common carotid
artery dissection or aneurysm.

--Internal carotid artery:No dissection, occlusion or aneurysm. No
hemodynamically significant stenosis.

--External carotid artery: No acute abnormality.

Vertebral arteries: Left dominant configuration. Both origins are
normal. No dissection, occlusion or flow-limiting stenosis to the
vertebrobasilar confluence.

Review of the MIP images confirms the above findings
IMPRESSION: 1. No dissection, occlusion or hemodynamically significant stenosis
of the carotid or vertebral arteries.
2. Left C4 transverse process fracture.

## 2022-05-06 ENCOUNTER — Emergency Department (HOSPITAL_COMMUNITY): Payer: 59

## 2022-05-06 ENCOUNTER — Encounter (HOSPITAL_COMMUNITY): Payer: Self-pay

## 2022-05-06 ENCOUNTER — Emergency Department (HOSPITAL_COMMUNITY)
Admission: EM | Admit: 2022-05-06 | Discharge: 2022-05-06 | Disposition: A | Discharge status: Home / Self Care | Payer: 59 | Attending: Emergency Medicine | Admitting: Emergency Medicine

## 2022-05-06 ENCOUNTER — Other Ambulatory Visit: Payer: Self-pay

## 2022-05-06 DIAGNOSIS — M25561 Pain in right knee: Secondary | ICD-10-CM | POA: Insufficient documentation

## 2022-05-06 DIAGNOSIS — W228XXA Striking against or struck by other objects, initial encounter: Secondary | ICD-10-CM | POA: Insufficient documentation

## 2022-05-06 MED ORDER — NAPROXEN 250 MG PO TABS
500.0000 mg | ORAL_TABLET | Freq: Once | ORAL | Status: AC
  Administered 2022-05-06: 500 mg via ORAL
  Filled 2022-05-06: qty 2

## 2022-05-06 NOTE — Discharge Instructions (Signed)
You were seen in the ER for evaluation of your knee pain. Your XR imaging was normal, but you may have some tendon or ligament injury. I would like for you to follow up with an orthopedic provider because of this. You can take '600mg'$  of ibuprofen and/or '1000mg'$  of Tylenol every 6 hours as needed for pain. Please call to schedule your appointment. If you have any concerns, new or worsening symptoms, please return to the nearest ER for re-evaluation.   Contact a doctor if: The knee pain does not stop. The knee pain changes or gets worse. You have a fever along with knee pain. Your knee is red or feels warm when you touch it. Your knee gives out or locks up. Get help right away if: Your knee swells, and the swelling gets worse. You cannot move your knee. You have very bad knee pain that does not get better with pain medicine.

## 2022-05-06 NOTE — ED Triage Notes (Signed)
Pt has pressure like pain in right knee, pain is around knee increasing in severity intermittently.  Pt states has had numbness in right leg. Pain started yesterday. Pt denies injury.

## 2022-05-06 NOTE — ED Provider Triage Note (Signed)
Emergency Medicine Provider Triage Evaluation Note  QUINTAVIS BRANDS , a 31 y.o. male  was evaluated in triage.  Pt complains of R knee pain x 6 days after he opened his car door and struck his knee. Has been on his feet at work a lot for the past 3 days. No medications taken PTA for pain management.  Review of Systems  Positive: As above Negative: As above  Physical Exam  BP 102/82 (BP Location: Right Arm)   Pulse 62   Temp 98 F (36.7 C) (Oral)   Resp 18   SpO2 100%  Gen:   Awake, no distress   Resp:  Normal effort  MSK:   Moves extremities without difficulty  Other:  No R knee effusion, crepitus, erythema.  Medical Decision Making  Medically screening exam initiated at 4:55 AM.  Appropriate orders placed.  GREOGORY CORNETTE was informed that the remainder of the evaluation will be completed by another provider, this initial triage assessment does not replace that evaluation, and the importance of remaining in the ED until their evaluation is complete.  R knee contusion - xray ordered to exclude fx.   Antonietta Breach, PA-C 05/06/22 [phone]

## 2022-05-06 NOTE — ED Provider Notes (Signed)
[facility] Provider Note   CSN: [id number] Arrival date & time: 05/06/22  0430     History Chief Complaint  Patient presents with   Knee Pain    Omar Moyer is a 31 y.o. male otherwise healthy presents to the emergency room for evaluation of right knee pain for the past 6 days.  Patient reports that 6 days ago, he was opening his car door and opened it and hit his knee on the side of the door.  Reports pain but was still ambulatory.  Patient reports that he works 12-hour long shifts, occasionally more, on his feet bending up and down in weightbearing on his knees.  Having to go up and down a lot of stairs. He reports he feels a tingling sensation in his knee, mainly on the medial aspect. Has not tried any ibuprofen or Tylenol for pain.  Denies any swelling, increased warmth, or IV drug use.  He reports that he was bending down in the kneeling position mainly weightbearing on the right knee fixing something at work when he started to have increased pain and decided to come into the emergency department.   Knee Pain Associated symptoms: no back pain and no fever        Home Medications Prior to Admission medications   Medication Sig Start Date End Date Taking? Authorizing Provider  ondansetron (ZOFRAN ODT) 4 MG disintegrating tablet Take 1 tablet (4 mg total) by mouth every 8 (eight) hours as needed for nausea or vomiting. 03/23/21   Pearson Forster, NP  oxyCODONE-acetaminophen (PERCOCET/ROXICET) 5-325 MG tablet Take 1-2 tablets by mouth every 6 (six) hours as needed for severe pain. 01/08/21   Petrucelli, Samantha R, PA-C  promethazine-dextromethorphan (PROMETHAZINE-DM) 6.25-15 MG/5ML syrup Take 5 mLs by mouth 4 (four) times daily as needed for cough. 03/23/21   Pearson Forster, NP      Allergies    Patient has no known allergies.    Review of Systems   Review of Systems  Constitutional:  Negative for chills and fever.  HENT:  Negative for  congestion and rhinorrhea.   Respiratory:  Negative for shortness of breath.   Cardiovascular:  Negative for chest pain.  Gastrointestinal:  Negative for abdominal pain, nausea and vomiting.  Musculoskeletal:  Positive for arthralgias. Negative for back pain.    Physical Exam Updated Vital Signs BP 102/82 (BP Location: Right Arm)   Pulse 62   Temp 98 F (36.7 C) (Oral)   Resp 18   Ht '6\' 2"'$  (1.88 m)   Wt 95.3 kg   SpO2 100%   BMI 26.96 kg/m  Physical Exam Constitutional:      General: He is not in acute distress.    Appearance: Normal appearance. He is not ill-appearing or toxic-appearing.  Eyes:     General: No scleral icterus. Pulmonary:     Effort: Pulmonary effort is normal. No respiratory distress.  Musculoskeletal:        General: Tenderness present. No swelling or deformity. Normal range of motion.     Right lower leg: No edema.     Left lower leg: No edema.     Comments: No overlying erythema or warmth to the area.  No overlying skin changes noted.  Compartments are soft.  Sensation intact.  Full flexion extension although does have some pain.  No laxity noted to the joint.  Patient is ambulatory.  Palpable pulses. Diffuse tenderness, but mainly over the medial aspect  Skin:    General: Skin is dry.     Findings: No rash.  Neurological:     General: No focal deficit present.     Mental Status: He is alert. Mental status is at baseline.  Psychiatric:        Mood and Affect: Mood normal.     ED Results / Procedures / Treatments   Labs (all labs ordered are listed, but only abnormal results are displayed) Labs Reviewed - No data to display  EKG None  Radiology DG Knee Complete 4 Views Right  Result Date: 05/06/2022 CLINICAL DATA:  Posttraumatic right knee pain for 6 days EXAM: RIGHT KNEE - COMPLETE 4+ VIEW COMPARISON:  None Available. FINDINGS: No evidence of fracture, dislocation, or joint effusion. No evidence of arthropathy or other focal bone  abnormality. Soft tissues are unremarkable. IMPRESSION: Negative. Electronically Signed   By: Jorje Guild M.D.   On: 05/06/2022 05:16    Procedures Procedures   Medications Ordered in ED Medications  naproxen (NAPROSYN) tablet 500 mg (500 mg Oral Given 05/06/22 0502)    ED Course/ Medical Decision Making/ A&P                           Medical Decision Making  31 year old male presents the emergency room for evaluation of right knee pain for the past 6 days.  Differential diagnosis includes was not much to contusion, fracture, dislocation, sprain, strain, and or ligamentous injury.  Vital signs are unremarkable.  Physical exam as noted above.  X-ray imaging is negative.  No evidence of any fracture, dislocation, or joint effusion.  No evidence of any arthropathy or other focal bony normality.  On evaluation, patient's knee appears to be symmetric with the other.  There is no overlying erythema or warmth to the area.  No swelling.  Compartments are soft with palpable pulses.  Sensations intact.  He does have some tenderness to the more medial aspect of the right knee.  He may have a muscle or tendon issue.  Likely just a contusion or bruising.  Doubt any septic arthritis. While the patient follow-up with orthopedics.  Placed patient in a knee for further support.  Recommended the patient's start taking Tylenol and ibuprofen as needed for pain.  We discussed the RICE method.  We discussed return precautions without symptoms.  Patient verbalizes understanding agrees the plan.  Patient is stable being discharged home in good condition.  Final Clinical Impression(s) / ED Diagnoses Final diagnoses:  Acute pain of right knee    Rx / DC Orders ED Discharge Orders     None         Sherrell Puller, PA-C 05/06/22 [place], MD 05/11/22 [phone]
